# Patient Record
Sex: Female | Born: 1975 | Race: Black or African American | Hispanic: No | Marital: Single | Smoking: Former smoker
Health system: Southern US, Community
[De-identification: ages and names within clinical notes are randomized; demographics above are authoritative.]

## PROBLEM LIST (undated history)

## (undated) DIAGNOSIS — J45909 Unspecified asthma, uncomplicated: Secondary | ICD-10-CM

## (undated) DIAGNOSIS — K219 Gastro-esophageal reflux disease without esophagitis: Secondary | ICD-10-CM

## (undated) DIAGNOSIS — Z91010 Allergy to peanuts: Secondary | ICD-10-CM

## (undated) DIAGNOSIS — F32A Depression, unspecified: Secondary | ICD-10-CM

## (undated) DIAGNOSIS — M549 Dorsalgia, unspecified: Secondary | ICD-10-CM

## (undated) DIAGNOSIS — M2550 Pain in unspecified joint: Secondary | ICD-10-CM

## (undated) DIAGNOSIS — E559 Vitamin D deficiency, unspecified: Secondary | ICD-10-CM

## (undated) DIAGNOSIS — K5900 Constipation, unspecified: Secondary | ICD-10-CM

## (undated) DIAGNOSIS — M7989 Other specified soft tissue disorders: Secondary | ICD-10-CM

## (undated) DIAGNOSIS — R0602 Shortness of breath: Secondary | ICD-10-CM

## (undated) DIAGNOSIS — D649 Anemia, unspecified: Secondary | ICD-10-CM

## (undated) DIAGNOSIS — F419 Anxiety disorder, unspecified: Secondary | ICD-10-CM

## (undated) DIAGNOSIS — R5383 Other fatigue: Secondary | ICD-10-CM

## (undated) DIAGNOSIS — T7840XA Allergy, unspecified, initial encounter: Secondary | ICD-10-CM

## (undated) HISTORY — DX: Shortness of breath: R06.02

## (undated) HISTORY — DX: Other fatigue: R53.83

## (undated) HISTORY — DX: Other specified soft tissue disorders: M79.89

## (undated) HISTORY — DX: Constipation, unspecified: K59.00

## (undated) HISTORY — DX: Vitamin D deficiency, unspecified: E55.9

## (undated) HISTORY — DX: Allergy, unspecified, initial encounter: T78.40XA

## (undated) HISTORY — DX: Pain in unspecified joint: M25.50

## (undated) HISTORY — DX: Anxiety disorder, unspecified: F41.9

## (undated) HISTORY — DX: Anemia, unspecified: D64.9

## (undated) HISTORY — DX: Allergy to peanuts: Z91.010

## (undated) HISTORY — PX: TUBAL LIGATION: SHX77

## (undated) HISTORY — DX: Dorsalgia, unspecified: M54.9

## (undated) HISTORY — DX: Depression, unspecified: F32.A

## (undated) HISTORY — DX: Gastro-esophageal reflux disease without esophagitis: K21.9

---

## 2005-11-10 ENCOUNTER — Emergency Department (HOSPITAL_COMMUNITY): Admission: EM | Admit: 2005-11-10 | Discharge: 2005-11-10 | Payer: Self-pay | Admitting: Emergency Medicine

## 2007-06-23 ENCOUNTER — Emergency Department (HOSPITAL_COMMUNITY): Admission: EM | Admit: 2007-06-23 | Discharge: 2007-06-24 | Payer: Self-pay | Admitting: Emergency Medicine

## 2009-12-07 ENCOUNTER — Other Ambulatory Visit: Admission: RE | Admit: 2009-12-07 | Discharge: 2009-12-07 | Payer: Self-pay | Admitting: Obstetrics and Gynecology

## 2010-07-06 ENCOUNTER — Inpatient Hospital Stay (HOSPITAL_COMMUNITY): Admission: RE | Admit: 2010-07-06 | Discharge: 2010-07-09 | Payer: Self-pay | Admitting: Obstetrics and Gynecology

## 2010-12-28 ENCOUNTER — Emergency Department (HOSPITAL_COMMUNITY)
Admission: EM | Admit: 2010-12-28 | Discharge: 2010-12-28 | Disposition: A | Discharge status: Home / Self Care | Payer: Medicaid Other | Attending: Emergency Medicine | Admitting: Emergency Medicine

## 2010-12-28 DIAGNOSIS — R05 Cough: Secondary | ICD-10-CM | POA: Insufficient documentation

## 2010-12-28 DIAGNOSIS — R070 Pain in throat: Secondary | ICD-10-CM | POA: Insufficient documentation

## 2010-12-28 DIAGNOSIS — J45909 Unspecified asthma, uncomplicated: Secondary | ICD-10-CM | POA: Insufficient documentation

## 2010-12-28 DIAGNOSIS — H9209 Otalgia, unspecified ear: Secondary | ICD-10-CM | POA: Insufficient documentation

## 2010-12-28 DIAGNOSIS — R059 Cough, unspecified: Secondary | ICD-10-CM | POA: Insufficient documentation

## 2010-12-28 DIAGNOSIS — J069 Acute upper respiratory infection, unspecified: Secondary | ICD-10-CM | POA: Insufficient documentation

## 2010-12-28 DIAGNOSIS — J3489 Other specified disorders of nose and nasal sinuses: Secondary | ICD-10-CM | POA: Insufficient documentation

## 2010-12-28 DIAGNOSIS — R0982 Postnasal drip: Secondary | ICD-10-CM | POA: Insufficient documentation

## 2010-12-28 DIAGNOSIS — R51 Headache: Secondary | ICD-10-CM | POA: Insufficient documentation

## 2010-12-28 LAB — CBC
HCT: 35.3 % — ABNORMAL LOW (ref 36.0–46.0)
Hemoglobin: 11.9 g/dL — ABNORMAL LOW (ref 12.0–15.0)
MCH: 28.7 pg (ref 26.0–34.0)
MCH: 29 pg (ref 26.0–34.0)
MCHC: 33.5 g/dL (ref 30.0–36.0)
MCHC: 33.7 g/dL (ref 30.0–36.0)
MCV: 85.2 fL (ref 78.0–100.0)
Platelets: 211 10*3/uL (ref 150–400)
RBC: 3.34 MIL/uL — ABNORMAL LOW (ref 3.87–5.11)
RDW: 14.1 % (ref 11.5–15.5)
RDW: 14.2 % (ref 11.5–15.5)

## 2011-07-27 LAB — POCT PREGNANCY, URINE
Operator id: 173591
Preg Test, Ur: NEGATIVE

## 2012-04-23 ENCOUNTER — Encounter (HOSPITAL_COMMUNITY): Payer: Self-pay | Admitting: Emergency Medicine

## 2012-04-23 ENCOUNTER — Emergency Department (INDEPENDENT_AMBULATORY_CARE_PROVIDER_SITE_OTHER)
Admission: EM | Admit: 2012-04-23 | Discharge: 2012-04-23 | Disposition: A | Discharge status: Home / Self Care | Payer: BC Managed Care – PPO | Source: Home / Self Care | Attending: Emergency Medicine | Admitting: Emergency Medicine

## 2012-04-23 DIAGNOSIS — J039 Acute tonsillitis, unspecified: Secondary | ICD-10-CM

## 2012-04-23 MED ORDER — AMOXICILLIN-POT CLAVULANATE 500-125 MG PO TABS: 1.0000 | ORAL_TABLET | Freq: Three times a day (TID) | ORAL | Status: AC

## 2012-04-23 MED ORDER — IBUPROFEN 800 MG PO TABS
800.0000 mg | ORAL_TABLET | Freq: Once | ORAL | Status: AC
  Administered 2012-04-23: 800 mg via ORAL

## 2012-04-23 MED ORDER — IBUPROFEN 800 MG PO TABS
ORAL_TABLET | ORAL | Status: AC
  Filled 2012-04-23: qty 1

## 2012-04-23 MED ORDER — HYDROCODONE-ACETAMINOPHEN 7.5-500 MG/15ML PO SOLN: 15.0000 mL | Freq: Four times a day (QID) | ORAL | Status: AC | PRN

## 2012-04-23 NOTE — ED Provider Notes (Signed)
History     CSN: [id number]  Arrival date & time 04/23/12  1335   First MD Initiated Contact with Patient 04/23/12 1508      Chief Complaint  Patient presents with  . Sore Throat    (Consider location/radiation/quality/duration/timing/severity/associated sxs/prior treatment) HPI Comments: Patient presents urgent care complaining of an ongoing sore throat for 3 days pain on the left side of her neck and throat. Have been having fevers chills. No cough, no shortness of breath no nasal congestion. 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 She has been taking Motrin and some Tylenol Sinus over-the-counter for her discomfort.  Patient is a 36 y.o. female presenting with pharyngitis. The history is provided by the patient.  Sore Throat This is a new problem. The current episode started more than 2 days ago. The problem occurs constantly. The problem has been gradually worsening. Pertinent negatives include no abdominal pain and no shortness of breath. The symptoms are aggravated by swallowing. She has tried nothing for the symptoms. The treatment provided mild relief.    History reviewed. No pertinent past medical history.  History reviewed. No pertinent past surgical history.  No family history on file.  History  Substance Use Topics  . Smoking status: Never Smoker   . Smokeless tobacco: Not on file  . Alcohol Use: No    OB History    Grav Para Term Preterm Abortions TAB SAB Ect Mult Living                  Review of Systems  Constitutional: Positive for fever, chills, activity change and appetite change. Negative for fatigue.  HENT: Positive for sore throat. Negative for congestion, sneezing and postnasal drip.   Respiratory: Negative for shortness of breath.   Gastrointestinal: Negative for abdominal pain.  Skin: Negative for rash.  Neurological: Negative for dizziness and numbness.     Allergies  Review of patient's allergies indicates no known allergies.  Home Medications   Current Outpatient Rx  Name Route Sig Dispense Refill  . PSEUDOEPHEDRINE-ACETAMINOPHEN 30-500 MG PO TABS Oral Take 1 tablet by mouth every 4 (four) hours as needed.    . THERAFLU NIGHTTIME MEDICINE PO Oral Take by mouth.      BP 119/75  Pulse 113  Temp 103 F (39.4 C) (Oral)  Resp 21  SpO2 100%  LMP 04/15/2012  Physical Exam  Nursing note and vitals reviewed. Constitutional: She appears well-developed and well-nourished.  HENT:  Head: Normocephalic.  Right Ear: Tympanic membrane normal.  Left Ear: Tympanic membrane normal.  Mouth/Throat: Uvula is midline and mucous membranes are normal. Posterior oropharyngeal erythema present. No oropharyngeal exudate, posterior oropharyngeal edema or tonsillar abscesses.  Eyes: Conjunctivae and EOM are normal. Pupils are equal, round, and reactive to light. No scleral icterus.  Neck: Neck supple. No JVD present.  Lymphadenopathy:    She has no cervical adenopathy.  Skin: No rash noted.    ED Course  Procedures (including critical care time)   Labs Reviewed  POCT RAPID STREP A ([facility] CARE ONLY)   No results found.   No diagnosis found.    MDM  Left-sided tonsillitis with reactive lymphadenopathy is an febrile to 103. Ruling out a streptococcal infection based on exam and unilateral aspect of her exam will consider a bacterial source and have her start with antibiotics and followup in 48-72 hours. Have advised her about symptoms that would warrant further  evaluation the emergency department such as difficulty swallowing liquids or fluids. Or worsening symptoms despite antibiotics        Jimmie Molly, MD 04/23/12 1541

## 2012-04-23 NOTE — ED Notes (Signed)
Onset 04/21/2012 of sore throat , fever, chills.  Denies cough. Denies any family members sick.  Denies clients being sick.

## 2013-01-15 ENCOUNTER — Other Ambulatory Visit: Payer: Self-pay | Admitting: Obstetrics and Gynecology

## 2013-01-15 ENCOUNTER — Other Ambulatory Visit (HOSPITAL_COMMUNITY)
Admission: RE | Admit: 2013-01-15 | Discharge: 2013-01-15 | Disposition: A | Discharge status: Home / Self Care | Payer: BC Managed Care – PPO | Source: Ambulatory Visit | Attending: Obstetrics and Gynecology | Admitting: Obstetrics and Gynecology

## 2013-01-15 DIAGNOSIS — Z113 Encounter for screening for infections with a predominantly sexual mode of transmission: Secondary | ICD-10-CM | POA: Insufficient documentation

## 2013-01-15 DIAGNOSIS — Z1151 Encounter for screening for human papillomavirus (HPV): Secondary | ICD-10-CM | POA: Insufficient documentation

## 2013-01-15 DIAGNOSIS — Z01419 Encounter for gynecological examination (general) (routine) without abnormal findings: Secondary | ICD-10-CM | POA: Insufficient documentation

## 2014-02-08 ENCOUNTER — Other Ambulatory Visit: Payer: Self-pay | Admitting: Obstetrics and Gynecology

## 2014-02-08 ENCOUNTER — Other Ambulatory Visit (HOSPITAL_COMMUNITY)
Admission: RE | Admit: 2014-02-08 | Discharge: 2014-02-08 | Disposition: A | Discharge status: Home / Self Care | Payer: BC Managed Care – PPO | Source: Ambulatory Visit | Attending: Obstetrics and Gynecology | Admitting: Obstetrics and Gynecology

## 2014-02-08 DIAGNOSIS — Z124 Encounter for screening for malignant neoplasm of cervix: Secondary | ICD-10-CM | POA: Insufficient documentation

## 2014-02-08 DIAGNOSIS — Z113 Encounter for screening for infections with a predominantly sexual mode of transmission: Secondary | ICD-10-CM | POA: Insufficient documentation

## 2014-02-08 DIAGNOSIS — Z1151 Encounter for screening for human papillomavirus (HPV): Secondary | ICD-10-CM | POA: Insufficient documentation

## 2014-05-03 ENCOUNTER — Emergency Department (HOSPITAL_COMMUNITY): Payer: BC Managed Care – PPO

## 2014-05-03 ENCOUNTER — Encounter (HOSPITAL_COMMUNITY): Payer: Self-pay | Admitting: Emergency Medicine

## 2014-05-03 ENCOUNTER — Emergency Department (HOSPITAL_COMMUNITY)
Admission: EM | Admit: 2014-05-03 | Discharge: 2014-05-04 | Disposition: A | Discharge status: Home / Self Care | Payer: BC Managed Care – PPO | Attending: Emergency Medicine | Admitting: Emergency Medicine

## 2014-05-03 DIAGNOSIS — Z3202 Encounter for pregnancy test, result negative: Secondary | ICD-10-CM | POA: Insufficient documentation

## 2014-05-03 DIAGNOSIS — E860 Dehydration: Secondary | ICD-10-CM | POA: Diagnosis present

## 2014-05-03 DIAGNOSIS — R61 Generalized hyperhidrosis: Secondary | ICD-10-CM | POA: Insufficient documentation

## 2014-05-03 DIAGNOSIS — Z79899 Other long term (current) drug therapy: Secondary | ICD-10-CM | POA: Insufficient documentation

## 2014-05-03 DIAGNOSIS — R110 Nausea: Secondary | ICD-10-CM | POA: Insufficient documentation

## 2014-05-03 DIAGNOSIS — R51 Headache: Secondary | ICD-10-CM | POA: Insufficient documentation

## 2014-05-03 DIAGNOSIS — J45909 Unspecified asthma, uncomplicated: Secondary | ICD-10-CM | POA: Insufficient documentation

## 2014-05-03 DIAGNOSIS — R079 Chest pain, unspecified: Secondary | ICD-10-CM | POA: Insufficient documentation

## 2014-05-03 HISTORY — DX: Unspecified asthma, uncomplicated: J45.909

## 2014-05-03 LAB — I-STAT TROPONIN, ED
Troponin i, poc: 0 ng/mL (ref 0.00–0.08)
Troponin i, poc: 0 ng/mL (ref 0.00–0.08)

## 2014-05-03 LAB — BASIC METABOLIC PANEL
Anion gap: 9 (ref 5–15)
BUN: 16 mg/dL (ref 6–23)
CHLORIDE: 106 meq/L (ref 96–112)
CO2: 24 meq/L (ref 19–32)
Calcium: 8.6 mg/dL (ref 8.4–10.5)
Creatinine, Ser: 0.97 mg/dL (ref 0.50–1.10)
GFR calc Af Amer: 85 mL/min — ABNORMAL LOW (ref >90)
GFR, EST NON AFRICAN AMERICAN: 74 mL/min — AB (ref >90)
GLUCOSE: 82 mg/dL (ref 70–99)
Potassium: 4.6 mEq/L (ref 3.7–5.3)
SODIUM: 139 meq/L (ref 137–147)

## 2014-05-03 LAB — CBC
HEMATOCRIT: 35.2 % — AB (ref 36.0–46.0)
HEMOGLOBIN: 11.6 g/dL — AB (ref 12.0–15.0)
MCH: 27.6 pg (ref 26.0–34.0)
MCHC: 33 g/dL (ref 30.0–36.0)
MCV: 83.6 fL (ref 78.0–100.0)
Platelets: 266 10*3/uL (ref 150–400)
RBC: 4.21 MIL/uL (ref 3.87–5.11)
RDW: 13.7 % (ref 11.5–15.5)
WBC: 6.4 10*3/uL (ref 4.0–10.5)

## 2014-05-03 LAB — D-DIMER, QUANTITATIVE (NOT AT ARMC): D DIMER QUANT: 0.66 ug{FEU}/mL — AB (ref 0.00–0.48)

## 2014-05-03 LAB — POC URINE PREG, ED: PREG TEST UR: NEGATIVE

## 2014-05-03 MED ORDER — IOHEXOL 350 MG/ML SOLN
100.0000 mL | Freq: Once | INTRAVENOUS | Status: AC | PRN
  Administered 2014-05-03: 100 mL via INTRAVENOUS

## 2014-05-03 NOTE — ED Notes (Signed)
Pt presents with chest pain that radiated to left arm started around 1415 today- pt reports pain has resolved but she feels like her heart is racing and feeling weak.  Denies having similar episode in the past.  Alert and oriented X 4.

## 2014-05-03 NOTE — Discharge Instructions (Signed)
Please follow-up with your Primary Care Physician. You may need a stress test. This will evaluate your heart under exertion. Your PCP will be able to arrange this for you. If you have recurrent chest pain lasting more than 5-10 minutes, especially when exerting yourself, please take 4 baby aspirins or one aspirin 325mg  and seek immediate medical attention.  Chest Pain (Nonspecific) It is often hard to give a specific diagnosis for the cause of chest pain. There is always a chance that your pain could be related to something serious, such as a heart attack or a blood clot in the lungs. You need to follow up with your health care provider for further evaluation. CAUSES   Heartburn.  Pneumonia or bronchitis.  Anxiety or stress.  Inflammation around your heart (pericarditis) or lung (pleuritis or pleurisy).  A blood clot in the lung.  A collapsed lung (pneumothorax). It can develop suddenly on its own (spontaneous pneumothorax) or from trauma to the chest.  Shingles infection (herpes zoster virus). The chest wall is composed of bones, muscles, and cartilage. Any of these can be the source of the pain.  The bones can be bruised by injury.  The muscles or cartilage can be strained by coughing or overwork.  The cartilage can be affected by inflammation and become sore (costochondritis). DIAGNOSIS  Lab tests or other studies may be needed to find the cause of your pain. Your health care provider may have you take a test called an ambulatory electrocardiogram (ECG). An ECG records your heartbeat patterns over a 24-hour period. You may also have other tests, such as:  Transthoracic echocardiogram (TTE). During echocardiography, sound waves are used to evaluate how blood flows through your heart.  Transesophageal echocardiogram (TEE).  Cardiac monitoring. This allows your health care provider to monitor your heart rate and rhythm in real time.  Holter monitor. This is a portable device that  records your heartbeat and can help diagnose heart arrhythmias. It allows your health care provider to track your heart activity for several days, if needed.  Stress tests by exercise or by giving medicine that makes the heart beat faster. TREATMENT   Treatment depends on what may be causing your chest pain. Treatment may include:  Acid blockers for heartburn.  Anti-inflammatory medicine.  Pain medicine for inflammatory conditions.  Antibiotics if an infection is present.  You may be advised to change lifestyle habits. This includes stopping smoking and avoiding alcohol, caffeine, and chocolate.  You may be advised to keep your head raised (elevated) when sleeping. This reduces the chance of acid going backward from your stomach into your esophagus. Most of the time, nonspecific chest pain will improve within 2-3 days with rest and mild pain medicine.  HOME CARE INSTRUCTIONS   If antibiotics were prescribed, take them as directed. Finish them even if you start to feel better.  For the next few days, avoid physical activities that bring on chest pain. Continue physical activities as directed.  Do not use any tobacco products, including cigarettes, chewing tobacco, or electronic cigarettes.  Avoid drinking alcohol.  Only take medicine as directed by your health care provider.  Follow your health care provider's suggestions for further testing if your chest pain does not go away.  Keep any follow-up appointments you made. If you do not go to an appointment, you could develop lasting (chronic) problems with pain. If there is any problem keeping an appointment, call to reschedule. SEEK MEDICAL CARE IF:   Your chest pain does  not go away, even after treatment.  You have a rash with blisters on your chest.  You have a fever. SEEK IMMEDIATE MEDICAL CARE IF:   You have increased chest pain or pain that spreads to your arm, neck, jaw, back, or abdomen.  You have shortness of  breath.  You have an increasing cough, or you cough up blood.  You have severe back or abdominal pain.  You feel nauseous or vomit.  You have severe weakness.  You faint.  You have chills. This is an emergency. Do not wait to see if the pain will go away. Get medical help at once. Call your local emergency services (911 in [place] Do not drive yourself to the hospital. MAKE SURE YOU:   Understand these instructions.  Will watch your condition.  Will get help right away if you are not doing well or get worse. Document Released: 07/11/2005 Document Revised: 10/06/2013 Document Reviewed: 05/06/2008 [facility] Patient Information 2015 [place], [place]. This information is not intended to replace advice given to you by your health care provider. Make sure you discuss any questions you have with your health care provider.

## 2014-05-03 NOTE — ED Provider Notes (Signed)
CSN: [id number]     Arrival date & time 05/03/14  1657 History   First MD Initiated Contact with Patient 05/03/14 2044     Chief Complaint  Patient presents with  . Chest Pain     (Consider location/radiation/quality/duration/timing/severity/associated sxs/prior Treatment) Patient is a [age] y.o. female presenting with chest pain.  Chest Pain Pain location:  Substernal area Pain quality: sharp   Pain radiates to:  Does not radiate Pain radiates to the back: no   Pain severity:  Moderate Onset quality:  Sudden Duration:  2 hours Timing:  Constant Context: at rest   Relieved by:  None tried Worsened by:  Nothing tried Ineffective treatments:  None tried Associated symptoms: diaphoresis, headache (chronic), nausea, palpitations and shortness of breath   Associated symptoms: not vomiting     Past Medical History  Diagnosis Date  . Asthma     History reviewed. No pertinent past surgical history. Family History  Problem Relation Age of Onset  . Stroke Maternal Aunt    History  Substance Use Topics  . Smoking status: Never Smoker   . Smokeless tobacco: Not on file  . Alcohol Use: 1.2 oz/week    2 Cans of beer per week  1-2 beers per week  OB History   Grav Para Term Preterm Abortions TAB SAB Ect Mult Living                 Review of Systems  Constitutional: Positive for diaphoresis.  Respiratory: Positive for shortness of breath.   Cardiovascular: Positive for chest pain and palpitations.  Gastrointestinal: Positive for nausea. Negative for vomiting.  Neurological: Positive for headaches (chronic).  All other systems reviewed and are negative.     Allergies  Peanut-containing drug products  Home Medications   Prior to Admission medications   Medication Sig Start Date End Date Taking? Authorizing Provider  albuterol (PROVENTIL HFA;VENTOLIN HFA) 108 (90 BASE) MCG/ACT inhaler Inhale 1-2 puffs into the lungs every 6 (six) hours as needed for wheezing or  shortness of breath.   Yes Historical Provider, MD  diphenhydrAMINE (BENADRYL) 25 mg capsule Take 25 mg by mouth daily as needed for allergies.   Yes Historical Provider, MD  Multiple Vitamins-Minerals (MULTIVITAMIN PO) Take 1 tablet by mouth daily.   Yes Historical Provider, MD  Norethindrone-Ethinyl Estradiol-Fe Biphas (LO LOESTRIN FE) 1 MG-10 MCG / 10 MCG tablet Take 1 tablet by mouth daily.   Yes Historical Provider, MD  olopatadine (PATANOL) 0.1 % ophthalmic solution Place 1 drop into both eyes daily as needed for allergies.   Yes Historical Provider, MD   BP 119/78  Pulse 73  Temp(Src) 99.4 F (37.4 C) (Oral)  Resp 16  SpO2 100%  LMP 05/03/2014 Physical Exam  Constitutional: She is oriented to person, place, and time. She appears well-developed and well-nourished.  Eyes: Conjunctivae and EOM are normal.  Cardiovascular: Normal rate, regular rhythm and normal heart sounds.   No murmur heard. Pulmonary/Chest: Effort normal and breath sounds normal. No respiratory distress. She has no wheezes. She has no rales.  Abdominal: Soft. Bowel sounds are normal.  Musculoskeletal:       Right lower leg: She exhibits no tenderness, no swelling and no edema.       Left lower leg: She exhibits no tenderness, no swelling and no edema.  Holman sign negative  Neurological: She is alert and oriented to person, place, and time.    ED Course  Procedures (including critical care time) Labs Review Labs  Reviewed  CBC - Abnormal; Notable for the following:    Hemoglobin 11.6 (*)    HCT 35.2 (*)    All other components within normal limits  BASIC METABOLIC PANEL - Abnormal; Notable for the following:    GFR calc non Af Amer 74 (*)    GFR calc Af Amer 85 (*)    All other components within normal limits  D-DIMER, QUANTITATIVE - Abnormal; Notable for the following:    D-Dimer, Quant 0.66 (*)    All other components within normal limits  I-STAT TROPOININ, ED  Rosezena Sensor, ED  POC URINE PREG,  ED    Imaging Review Dg Chest 2 View  05/03/2014   CLINICAL DATA:  Mid chest pain.  EXAM: CHEST  2 VIEW  COMPARISON:  Chest radiograph performed 06/23/2007  FINDINGS: The lungs are well-aerated. Mild vascular congestion is noted. Minimal bibasilar atelectasis is noted. There is no evidence of pleural effusion or pneumothorax.  The heart is normal in size; the mediastinal contour is within normal limits. No acute osseous abnormalities are seen.  IMPRESSION: Mild vascular congestion noted. Minimal bibasilar atelectasis seen. Lungs otherwise clear.   Electronically Signed   By: Roanna Raider M.D.   On: 05/03/2014 23:33     EKG Interpretation   Date/Time:  Monday May 03 2014 17:08:11 EDT Ventricular Rate:  95 PR Interval:  166 QRS Duration: 64 QT Interval:  330 QTC Calculation: 414 R Axis:   44 Text Interpretation:  Normal sinus rhythm Low voltage QRS Cannot rule out  Anterior infarct , age undetermined Abnormal ECG No significant change  since last tracing Confirmed by BEATON  MD, ROBERT ([id number]) on 05/03/2014  11:23:13 PM      MDM   Final diagnoses:  Chest pain, unspecified chest pain type   Patient's chest pain resolved before arrival to the ED. No symptoms of shortness of breath or diaphoresis. EKG without sign of acute MI. Negative delta troponin. Chest x-ray not significant for cause of chest pain. D-dimer elevated to 0.66 with negative follow-up CT angiogram for pulmonary embolism. Recommended patient follow-up with PCP and have follow-up stress test. Red flags reviewed. Patient understands and agrees with plan.  Jacquelin Hawking, MD 05/04/14 [phone]

## 2014-05-04 NOTE — ED Provider Notes (Signed)
I saw and evaluated the patient, reviewed the resident's note and I agree with the findings and plan.   .Face to face Exam:  General:  Awake HEENT:  Atraumatic Resp:  Normal effort Abd:  Nondistended Neuro:No focal weakness  EKG was reviewed by me and discussed with the resident  Nelia Shiobert L Khristian Seals, MD 05/04/14 1535

## 2015-02-11 ENCOUNTER — Other Ambulatory Visit: Payer: Self-pay | Admitting: Obstetrics and Gynecology

## 2015-02-11 ENCOUNTER — Other Ambulatory Visit (HOSPITAL_COMMUNITY)
Admission: RE | Admit: 2015-02-11 | Discharge: 2015-02-11 | Disposition: A | Discharge status: Home / Self Care | Payer: Medicaid Other | Source: Ambulatory Visit | Attending: Obstetrics and Gynecology | Admitting: Obstetrics and Gynecology

## 2015-02-11 DIAGNOSIS — Z01411 Encounter for gynecological examination (general) (routine) with abnormal findings: Secondary | ICD-10-CM | POA: Diagnosis not present

## 2015-02-11 DIAGNOSIS — Z113 Encounter for screening for infections with a predominantly sexual mode of transmission: Secondary | ICD-10-CM | POA: Diagnosis present

## 2015-02-14 LAB — CYTOLOGY - PAP

## 2015-12-29 ENCOUNTER — Other Ambulatory Visit (HOSPITAL_COMMUNITY): Payer: Self-pay | Admitting: Obstetrics and Gynecology

## 2015-12-29 NOTE — Patient Instructions (Signed)
Your procedure is scheduled on:  Wednesday, January 11, 2016  [facility]:  6:00 AM   Pick up the phone at the desk and dial [phone].  Call this number if you have problems the morning of surgery: [phone].  Remember:  Do NOT eat food or drink after:  Midnight Tuesday  Take these medicines the morning of surgery with a SIP OF WATER:  None  Bring Asthma inhaler day of surgery  Do NOT wear jewelry (body piercing), metal hair clips/bobby pins, make-up, or nail polish. Do NOT wear lotions, powders, or perfumes.  You may wear deoderant. Do NOT shave for 48 hours prior to surgery. Do NOT bring valuables to the hospital. Contacts, dentures, or bridgework may not be worn into surgery.  Have a responsible adult drive you home and stay with you for 24 hours after your procedure

## 2015-12-30 ENCOUNTER — Encounter (HOSPITAL_COMMUNITY)
Admission: RE | Admit: 2015-12-30 | Discharge: 2015-12-30 | Disposition: A | Discharge status: Home / Self Care | Payer: Medicaid Other | Source: Ambulatory Visit | Attending: Obstetrics and Gynecology | Admitting: Obstetrics and Gynecology

## 2015-12-30 ENCOUNTER — Encounter (HOSPITAL_COMMUNITY): Payer: Self-pay

## 2015-12-30 DIAGNOSIS — Z01812 Encounter for preprocedural laboratory examination: Secondary | ICD-10-CM | POA: Diagnosis present

## 2015-12-30 LAB — CBC
HCT: 36.2 % (ref 36.0–46.0)
Hemoglobin: 12.2 g/dL (ref 12.0–15.0)
MCH: 28 pg (ref 26.0–34.0)
MCHC: 33.7 g/dL (ref 30.0–36.0)
MCV: 83 fL (ref 78.0–100.0)
PLATELETS: 263 10*3/uL (ref 150–400)
RBC: 4.36 MIL/uL (ref 3.87–5.11)
RDW: 14.4 % (ref 11.5–15.5)
WBC: 5.4 10*3/uL (ref 4.0–10.5)

## 2016-01-10 ENCOUNTER — Encounter (HOSPITAL_COMMUNITY): Payer: Self-pay | Admitting: Anesthesiology

## 2016-01-10 ENCOUNTER — Other Ambulatory Visit (HOSPITAL_COMMUNITY): Payer: Self-pay | Admitting: Obstetrics and Gynecology

## 2016-01-10 NOTE — H&P (Signed)
Chief Complaint(s):   PreOp for 01/11/16   HPI:  General 40 y/o presents for preop history and physical in preparation for bilateral laparoscopic tubal ligation scheduled for 01/11/2016. she desires permanent sterilization.  Current Medication:  Taking  ProAir HFA(Albuterol Sulfate HFA) 108 (90 Base) MCG/ACT Aerosol Solution 2 puffs as needed Inhalation every 4 hrs prn wheezing     Loestrin 1/20 (21)(Norethindrone Acet-Ethinyl Est) 1-20 MG-MCG Tablet 1 tablet Orally Daily for Three Weeks, 1 Week off     Medication List reviewed and reconciled with the patient   Medical History:   endometriosis     asthma     allergies     anemia      Allergies/Intolerance:   [place] pigs, peanuts, mold, grasses   Gyn History:   Sexual activity currently sexually active. Periods : every month. LMP 12/11/15. Birth control Lo loestrin. Last pap smear date 02/11/15. Last mammogram date more than two years-family hx. H/O Abnormal pap smear treated with cryo, assessed with colposcopy. H/O STD Gonorrhea, syphillis(6 years ago), [facility] 2014. Menarche 13. GYN procedures Colposcopy then cryotherapy 2006.   OB History:   Number of pregnancies 3. Pregnancy # 1 1998, live birth, girl, vaginal delivery. Pregnancy # 2 07/07/10, live birth, vaginal delivery, boy "[place]", birth weight 5.7. Pregnancy # 3 abortion.   Surgical History:   abortion 12/2013   Hospitalization:   childbirth x 1 1998     childbirth x 1 [facility] 07/07/10   Family History:   Father: alive, A + W    Mother: alive, fibrocystic breast disease, migraines    Maternal Grand Father: Colon cancer    Maternal Grand Mother: breast cancer, diagnosed with Breast Ca    Brother 1: alive, A + W    Sister 1: alive, A + W, migraines    Maternal uncle: Colon cancer    1 brother(s) , 1 sister(s) . 1 son(s) , 1 daughter(s) .   Social History:  General Tobacco use cigarettes: Former smoker, Quit in year 2000, Tobacco history last updated 12/27/2015.  no  EXPOSURE TO PASSIVE SMOKE.  Alcohol: yes, occasionally, beer .  Caffeine: yes, tea in am,coffee.  DIET: yes, drinks water, regular.  Exercise: yes, minimal, nothing structured.  Marital Status: single.  Children: 2.  OCCUPATION: employed, Production designer, theatre/television/film.  Seat belt use: yes.  ROS: CONSTITUTIONAL none" label="Fatigue" value="" options="no,yes" propid="91" itemid="[id number]" categoryid="10464" encounterid="[id number]"Fatigue none. none today" label="Fever" value="" options="no,yes" propid="91" itemid="10467" categoryid="10464" encounterid="[id number]"Fever none today.  CARDIOLOGY none" label="Chest pain" value="" options="no,yes" propid="91" itemid="[id number]" categoryid="10488" encounterid="[id number]"Chest pain none.  RESPIRATORY no" label="Shortness of breath" value="" options="no" propid="91" itemid="270013" categoryid="138132" encounterid="[id number]"Shortness of breath no. no" label="Cough" value="" options="no,yes" propid="91" itemid="172745" categoryid="138132" encounterid="[id number]"Cough no.  GASTROENTEROLOGY none" label="Appetite change" value="" options="no,yes" propid="91" itemid="193447" categoryid="10494" encounterid="[id number]"Appetite change none. no" label="Change in bowel habits" value="" options="no,yes" propid="91" itemid="193449" categoryid="10494" encounterid="[id number]"Change in bowel habits no.  FEMALE REPRODUCTIVE no" label="Breast lumps or discharge" value="" options="no,yes" propid="91" itemid="196298" categoryid="10525" encounterid="[id number]"Breast lumps or discharge no. none" label="Breast pain" value="" options="no,yes" propid="91" itemid="186083" categoryid="10525" encounterid="[id number]"Breast pain none. none" label="Dyspareunia" value="" options="no,yes" propid="91" itemid="138198" categoryid="10525" encounterid="[id number]"Dyspareunia none. no" label="Dysuria" value="" options="no,yes" propid="91" itemid="202654" categoryid="10525" encounterid="[id number]"Dysuria no. none" label="Pelvic  pain" value="" options="no,yes" propid="91" itemid="186082" categoryid="10525" encounterid="[id number]"Pelvic pain none. yes" label="Regular menses" value="" options="no,yes" propid="91" itemid="199173" categoryid="10525" encounterid="[id number]"Regular menses yes. yes" label="Unusual vaginal discharge" value="" options="no,yes" propid="91" itemid="278230" categoryid="10525" encounterid="[id number]"Unusual vaginal discharge yes. no" label="Vaginal itching" value="" options="no,yes" propid="91" itemid="278942" categoryid="10525" encounterid="[id number]"Vaginal itching no. no" label="Vulvar/labial lesion" value="" options="no,yes" propid="91" itemid="278837" categoryid="10525" encounterid="[id number]"Vulvar/labial lesion no.  NEUROLOGY none" label="Migraines" value="" options="no,yes"  propid="91" [phone]="193627" categoryid="12512" encounterid="[id number]"Migraines none. none" label="Tingling/numbness" value="" options="no,yes" propid="91" itemid="12514" categoryid="12512" encounterid="[id number]"Tingling/numbness none. none" label="Visual changes" value="" options="no,yes" propid="91" itemid="193467" categoryid="12512" encounterid="[id number]"Visual changes none.  PSYCHOLOGY no" label="Depression" value="" options="" propid="91" itemid="275919" categoryid="10520" encounterid="[id number]"Depression no.  SKIN no" label="Rash" value="" options="no,yes" propid="91" itemid="269383" categoryid="202750" encounterid="[id number]"Rash no. no" label="Suspicious lesions" value="" options="no,yes" propid="91" itemid="202757" categoryid="202750" encounterid="[id number]"Suspicious lesions no.  ENDOCRINOLOGY none" label="Hot flashes" value="" options="no,yes" propid="91" itemid="202624" categoryid="12508" encounterid="[id number]"Hot flashes none. no unintentional" label="Weight gain" value="" options="no,yes" propid="91" itemid="193436" categoryid="12508" encounterid="[id number]"Weight gain no unintentional. none" label="Weight loss" value="" options="no,yes"  propid="91" itemid="138164" categoryid="12508" encounterid="[id number]"Weight loss none.  HEMATOLOGY/LYMPH no" label="Anemia" value="" options="no,yes" propid="91" itemid="193454" categoryid="138157" encounterid="[id number]"Anemia no.    Objective: Vitals:  Wt 240, Wt change 12 lb, Pulse sitting 83, BP sitting 112/79  Past Results:  Examination:  General Examination McCoy,Tiffany 12/27/2015 09:11:56 AM &gt; , for pelvic exam only" label="CHAPERONE PRESENT" categoryPropId="[id number]" examid="[id number]"CHAPERONE PRESENT McCoy,Tiffany 12/27/2015 09:11:56 AM > , for pelvic exam only.  Physical Examination: GENERAL in NAD, pleasant" label="Patient appears"Patient appears in NAD, pleasant. well developed" label="Build:"Build: well developed. overweight" label="General Appearance:"General Appearance: overweight. african-american" label="Race:"Race: african-american.  LUNGS clear to auscultation" label="Breath sounds:"Breath sounds: clear to auscultation. no" label="Dyspnea:"Dyspnea: no.  HEART none" label="Murmurs:"Murmurs: none. normal" label="Rate:"Rate: normal. regular" label="Rhythm:"Rhythm: regular.  ABDOMEN no masses,tenderness,organomegaly, no CVAT" label="General:"General: no masses,tenderness,organomegaly, no CVAT.  FEMALE GENITOURINARY no mass, non tender" label="Adnexa:"Adnexa: no mass, non tender. normal, no lesions" label="Anus/perineum:"Anus/perineum: normal, no lesions. normal appearance , no lesions/discharge/bleeding, good pelvic support , external os normal " label="Cervix/ cuff:"Cervix/ cuff: normal appearance , no lesions/discharge/bleeding, good pelvic support , external os normal . normal, no lesions, no skin discoloration, no lymphadenopathy" label="External genitalia:"External genitalia: normal, no lesions, no skin discoloration, no lymphadenopathy. normal external meatus" label="Urethra:"Urethra: normal external meatus. normal size/shape/consistency, freely mobile, non tender"  label="Uterus:"Uterus: normal size/shape/consistency, freely mobile, non tender. deferred" label="Rectum:"Rectum: deferred. pink/moist mucosa, no lesions, no abnormal discharge, odorless" label="Vagina:"Vagina: pink/moist mucosa, no lesions, no abnormal discharge, odorless. normal, no lesions, no skin discoloration, non tender" label="Vulva:"Vulva: normal, no lesions, no skin discoloration, non tender.  EXTREMITIES FROM of all extremities" label="Extremities"Extremities FROM of all extremities.  NEUROLOGICAL normal" label="Gait:"Gait: normal. alert and oriented x 3" label="Orientation:"Orientation: alert and oriented x 3.    Assessment: Assessment:  Encounter for sterilization - Z30.2 (Primary)     Vaginal discharge - N89.8     Plan: Treatment:  Encounter for sterilization  Notes: pt desires laparoscopic bilateral tubal ligation. R/B/A of surgery discussed with the patient including but not limited to infection/ bleeding damage to bowel bladder with the need for further surgery. 1 % r/o failure discussed with 50 % r/o ectopic pregnancy if failure occurs .. pt voiced understanding and desires to proceed.  Vaginal discharge  Lab:[facility]

## 2016-01-10 NOTE — Anesthesia Preprocedure Evaluation (Addendum)
Anesthesia Evaluation  Patient identified by MRN, date of birth, ID band Patient awake    Reviewed: Allergy & Precautions, H&P , NPO status , Patient's Chart, lab work & pertinent test results  Airway Mallampati: III  TM Distance: >3 FB Neck ROM: full    Dental no notable dental hx. (+) Teeth Intact   Pulmonary    Pulmonary exam normal        Cardiovascular negative cardio ROS Normal cardiovascular exam     Neuro/Psych negative neurological ROS  negative psych ROS   GI/Hepatic negative GI ROS, Neg liver ROS,   Endo/Other  Morbid obesity  Renal/GU negative Renal ROS     Musculoskeletal   Abdominal (+) + obese,   Peds  Hematology negative hematology ROS (+)   Anesthesia Other Findings   Reproductive/Obstetrics negative OB ROS                            Anesthesia Physical Anesthesia Plan  ASA: III  Anesthesia Plan: General   Post-op Pain Management:    Induction: Intravenous  Airway Management Planned: Oral ETT  Additional Equipment:   Intra-op Plan:   Post-operative Plan: Extubation in OR  Informed Consent:   Plan Discussed with: Surgeon and CRNA  Anesthesia Plan Comments:        Anesthesia Quick Evaluation

## 2016-01-11 ENCOUNTER — Encounter (HOSPITAL_COMMUNITY): Payer: Self-pay

## 2016-01-11 ENCOUNTER — Ambulatory Visit (HOSPITAL_COMMUNITY): Payer: Medicaid Other | Admitting: Anesthesiology

## 2016-01-11 ENCOUNTER — Encounter (HOSPITAL_COMMUNITY)
Admission: RE | Disposition: A | Discharge status: Home / Self Care | Payer: Self-pay | Source: Ambulatory Visit | Attending: Obstetrics and Gynecology

## 2016-01-11 ENCOUNTER — Ambulatory Visit (HOSPITAL_COMMUNITY)
Admission: RE | Admit: 2016-01-11 | Discharge: 2016-01-11 | Disposition: A | Discharge status: Home / Self Care | Payer: Medicaid Other | Source: Ambulatory Visit | Attending: Obstetrics and Gynecology | Admitting: Obstetrics and Gynecology

## 2016-01-11 DIAGNOSIS — Z6839 Body mass index (BMI) 39.0-39.9, adult: Secondary | ICD-10-CM | POA: Diagnosis not present

## 2016-01-11 DIAGNOSIS — Z302 Encounter for sterilization: Secondary | ICD-10-CM | POA: Insufficient documentation

## 2016-01-11 DIAGNOSIS — Z9851 Tubal ligation status: Secondary | ICD-10-CM

## 2016-01-11 HISTORY — PX: LAPAROSCOPIC TUBAL LIGATION: SHX1937

## 2016-01-11 LAB — PREGNANCY, URINE: Preg Test, Ur: NEGATIVE

## 2016-01-11 SURGERY — LIGATION, FALLOPIAN TUBE, LAPAROSCOPIC
Anesthesia: General | Laterality: Bilateral

## 2016-01-11 MED ORDER — ONDANSETRON HCL 4 MG/2ML IJ SOLN
INTRAMUSCULAR | Status: AC
Start: 2016-01-11 — End: 2016-01-11
  Filled 2016-01-11: qty 2

## 2016-01-11 MED ORDER — ROCURONIUM BROMIDE 100 MG/10ML IV SOLN
INTRAVENOUS | Status: DC | PRN
  Administered 2016-01-11: 25 mg via INTRAVENOUS
  Administered 2016-01-11: 10 mg via INTRAVENOUS

## 2016-01-11 MED ORDER — NEOSTIGMINE METHYLSULFATE 10 MG/10ML IV SOLN
INTRAVENOUS | Status: AC
  Filled 2016-01-11: qty 1

## 2016-01-11 MED ORDER — FENTANYL CITRATE (PF) 100 MCG/2ML IJ SOLN: 25.0000 ug | INTRAMUSCULAR | Status: DC | PRN

## 2016-01-11 MED ORDER — DEXAMETHASONE SODIUM PHOSPHATE 10 MG/ML IJ SOLN
INTRAMUSCULAR | Status: DC | PRN
  Administered 2016-01-11: 4 mg via INTRAVENOUS

## 2016-01-11 MED ORDER — NEOSTIGMINE METHYLSULFATE 10 MG/10ML IV SOLN
INTRAVENOUS | Status: DC | PRN
  Administered 2016-01-11: 3 mg via INTRAVENOUS

## 2016-01-11 MED ORDER — ONDANSETRON HCL 4 MG/2ML IJ SOLN
INTRAMUSCULAR | Status: DC | PRN
  Administered 2016-01-11: 4 mg via INTRAVENOUS

## 2016-01-11 MED ORDER — LIDOCAINE HCL (CARDIAC) 20 MG/ML IV SOLN
INTRAVENOUS | Status: DC | PRN
  Administered 2016-01-11: 50 mg via INTRAVENOUS

## 2016-01-11 MED ORDER — DEXAMETHASONE SODIUM PHOSPHATE 4 MG/ML IJ SOLN
INTRAMUSCULAR | Status: AC
  Filled 2016-01-11: qty 1

## 2016-01-11 MED ORDER — ONDANSETRON HCL 4 MG/2ML IJ SOLN: 4.0000 mg | Freq: Once | INTRAMUSCULAR | Status: DC | PRN

## 2016-01-11 MED ORDER — KETOROLAC TROMETHAMINE 30 MG/ML IJ SOLN: 30.0000 mg | Freq: Once | INTRAMUSCULAR | Status: DC

## 2016-01-11 MED ORDER — IBUPROFEN 100 MG/5ML PO SUSP
200.0000 mg | Freq: Four times a day (QID) | ORAL | Status: DC | PRN
  Filled 2016-01-11: qty 20

## 2016-01-11 MED ORDER — GLYCOPYRROLATE 0.2 MG/ML IJ SOLN
INTRAMUSCULAR | Status: DC | PRN
  Administered 2016-01-11: 0.4 mg via INTRAVENOUS

## 2016-01-11 MED ORDER — PROPOFOL 10 MG/ML IV BOLUS
INTRAVENOUS | Status: DC | PRN
  Administered 2016-01-11: 200 mg via INTRAVENOUS

## 2016-01-11 MED ORDER — SILVER NITRATE-POT NITRATE 75-25 % EX MISC
CUTANEOUS | Status: DC | PRN
  Administered 2016-01-11: 3

## 2016-01-11 MED ORDER — LACTATED RINGERS IV SOLN
INTRAVENOUS | Status: DC
  Administered 2016-01-11: 125 mL/h via INTRAVENOUS
  Administered 2016-01-11: 08:00:00 via INTRAVENOUS

## 2016-01-11 MED ORDER — SCOPOLAMINE 1 MG/3DAYS TD PT72
MEDICATED_PATCH | TRANSDERMAL | Status: AC
  Filled 2016-01-11: qty 1

## 2016-01-11 MED ORDER — PROPOFOL 10 MG/ML IV BOLUS
INTRAVENOUS | Status: AC
  Filled 2016-01-11: qty 20

## 2016-01-11 MED ORDER — IBUPROFEN 800 MG PO TABS: ORAL_TABLET | ORAL | Status: DC

## 2016-01-11 MED ORDER — FENTANYL CITRATE (PF) 100 MCG/2ML IJ SOLN
INTRAMUSCULAR | Status: DC | PRN
  Administered 2016-01-11: 100 ug via INTRAVENOUS
  Administered 2016-01-11: 50 ug via INTRAVENOUS

## 2016-01-11 MED ORDER — MIDAZOLAM HCL 2 MG/2ML IJ SOLN
INTRAMUSCULAR | Status: DC | PRN
  Administered 2016-01-11: 2 mg via INTRAVENOUS

## 2016-01-11 MED ORDER — HYDROCODONE-ACETAMINOPHEN 7.5-325 MG PO TABS: 1.0000 | ORAL_TABLET | Freq: Once | ORAL | Status: DC | PRN

## 2016-01-11 MED ORDER — MEPERIDINE HCL 25 MG/ML IJ SOLN: 6.2500 mg | INTRAMUSCULAR | Status: DC | PRN

## 2016-01-11 MED ORDER — GLYCOPYRROLATE 0.2 MG/ML IJ SOLN
INTRAMUSCULAR | Status: AC
  Filled 2016-01-11: qty 3

## 2016-01-11 MED ORDER — OXYCODONE-ACETAMINOPHEN 5-325 MG PO TABS: 1.0000 | ORAL_TABLET | ORAL | Status: DC | PRN

## 2016-01-11 MED ORDER — KETOROLAC TROMETHAMINE 30 MG/ML IJ SOLN
INTRAMUSCULAR | Status: DC | PRN
  Administered 2016-01-11: 30 mg via INTRAVENOUS

## 2016-01-11 MED ORDER — KETOROLAC TROMETHAMINE 30 MG/ML IJ SOLN
INTRAMUSCULAR | Status: AC
  Filled 2016-01-11: qty 1

## 2016-01-11 MED ORDER — BUPIVACAINE HCL (PF) 0.25 % IJ SOLN
INTRAMUSCULAR | Status: DC | PRN
  Administered 2016-01-11: 30 mL

## 2016-01-11 MED ORDER — MIDAZOLAM HCL 2 MG/2ML IJ SOLN
INTRAMUSCULAR | Status: AC
  Filled 2016-01-11: qty 2

## 2016-01-11 MED ORDER — LIDOCAINE HCL (PF) 1 % IJ SOLN
INTRAMUSCULAR | Status: AC
  Filled 2016-01-11: qty 5

## 2016-01-11 MED ORDER — IBUPROFEN 200 MG PO TABS
200.0000 mg | ORAL_TABLET | Freq: Four times a day (QID) | ORAL | Status: DC | PRN
  Filled 2016-01-11: qty 2

## 2016-01-11 MED ORDER — FENTANYL CITRATE (PF) 250 MCG/5ML IJ SOLN
INTRAMUSCULAR | Status: AC
  Filled 2016-01-11: qty 5

## 2016-01-11 MED ORDER — SILVER NITRATE-POT NITRATE 75-25 % EX MISC
CUTANEOUS | Status: AC
  Filled 2016-01-11: qty 1

## 2016-01-11 MED ORDER — GLYCOPYRROLATE 0.2 MG/ML IJ SOLN
INTRAMUSCULAR | Status: AC
Start: 2016-01-11 — End: 2016-01-11
  Filled 2016-01-11: qty 3

## 2016-01-11 MED ORDER — BUPIVACAINE HCL (PF) 0.25 % IJ SOLN
INTRAMUSCULAR | Status: AC
  Filled 2016-01-11: qty 30

## 2016-01-11 MED ORDER — SCOPOLAMINE 1 MG/3DAYS TD PT72
1.0000 | MEDICATED_PATCH | Freq: Once | TRANSDERMAL | Status: DC
  Administered 2016-01-11: 1.5 mg via TRANSDERMAL

## 2016-01-11 SURGICAL SUPPLY — 24 items
APPLICATOR COTTON TIP 6IN STRL (MISCELLANEOUS) ×3 IMPLANT
CATH ROBINSON RED A/P 16FR (CATHETERS) ×3 IMPLANT
CHLORAPREP W/TINT 26ML (MISCELLANEOUS) ×3 IMPLANT
CLOTH BEACON ORANGE TIMEOUT ST (SAFETY) ×3 IMPLANT
DILATOR CANAL MILEX (MISCELLANEOUS) IMPLANT
DRSG OPSITE POSTOP 3X4 (GAUZE/BANDAGES/DRESSINGS) ×2 IMPLANT
GLOVE BIO SURGEON STRL SZ7 (GLOVE) ×4 IMPLANT
GLOVE BIOGEL M 6.5 STRL (GLOVE) ×6 IMPLANT
GLOVE BIOGEL PI IND STRL 6.5 (GLOVE) ×1 IMPLANT
GLOVE BIOGEL PI IND STRL 7.0 (GLOVE) ×1 IMPLANT
GLOVE BIOGEL PI INDICATOR 6.5 (GLOVE) ×2
GLOVE BIOGEL PI INDICATOR 7.0 (GLOVE) ×2
GOWN STRL REUS W/TWL LRG LVL3 (GOWN DISPOSABLE) ×6 IMPLANT
LIQUID BAND (GAUZE/BANDAGES/DRESSINGS) ×3 IMPLANT
PACK LAPAROSCOPY BASIN (CUSTOM PROCEDURE TRAY) ×3 IMPLANT
PAD TRENDELENBURG POSITION (MISCELLANEOUS) ×3 IMPLANT
SLEEVE XCEL OPT CAN 5 100 (ENDOMECHANICALS) ×1 IMPLANT
SOLUTION ELECTROLUBE (MISCELLANEOUS) ×3 IMPLANT
SUT VICRYL 0 UR6 27IN ABS (SUTURE) ×3 IMPLANT
SUT VICRYL 4-0 PS2 18IN ABS (SUTURE) ×3 IMPLANT
TOWEL OR 17X24 6PK STRL BLUE (TOWEL DISPOSABLE) ×6 IMPLANT
TROCAR XCEL NON-BLD 11X100MML (ENDOMECHANICALS) ×3 IMPLANT
WARMER LAPAROSCOPE (MISCELLANEOUS) ×3 IMPLANT
WATER STERILE IRR 1000ML POUR (IV SOLUTION) ×3 IMPLANT

## 2016-01-11 NOTE — Discharge Instructions (Signed)

## 2016-01-11 NOTE — Op Note (Signed)
01/11/2016  8:38 AM  PATIENT:  Tricia McclintockAdrienne Gowin  40 y.o. female  PRE-OPERATIVE DIAGNOSIS:  Z30.8 Contraceptive Management  POST-OPERATIVE DIAGNOSIS:  Z30.8 Contraceptive Management  PROCEDURE:  Procedure(s): LAPAROSCOPIC BILATERAL TUBAL LIGATION (Bilateral)  SURGEON:  Surgeon(s) and Role:    * Gerald Leitzara Pratik Dalziel, MD - Primary  PHYSICIAN ASSISTANT:   ASSISTANTS: none   ANESTHESIA:   general  EBL:  Total I/O In: 1000 [I.V.:1000] Out: 55 [Urine:50; Blood:5]  BLOOD ADMINISTERED:none  DRAINS: none   LOCAL MEDICATIONS USED:  MARCAINE     SPECIMEN:  No Specimen  DISPOSITION OF SPECIMEN:  N/A  COUNTS:  YES  TOURNIQUET:  * No tourniquets in log *  DICTATION: .Dragon Dictation  PLAN OF CARE: Discharge to home after PACU  PATIENT DISPOSITION:  PACU - hemodynamically stable.   Delay start of Pharmacological VTE agent (>24hrs) due to surgical blood loss or risk of bleeding: not applicable  Procedure: The patient was taken to the operating room where she was placed under general anesthesia. She is placed in the dorsolithotomy position. She was prepped and draped in the usual sterile fashion. Speculum was placed into the vaginal vault. In the anterior lip of the cervix grasped with a single-tooth tenaculum. A uterine manipulator was placed without difficulty. The single-tooth tenaculum was  Removed. The speculum was removed.  Attention was turned to the umbilicus which was injected with 10 cc of quarter percent Marcaine. A 11 mm incision was made with the scalpel. A 11 mm trocar was placed under direct visualization. Entry into the peritoneum was visualized. The pneumoperitoneum was achieved with CO2 gas.  Her  fallopian tubes and ovaries appeared normal.  Operative scope was inserted and the right fallopian tube was identified and followed out to the fimbriated end. The a 2-3 cm portion of the right fallopian tube was cauterized with the Gyrus.  And incised  with laparoscopic scissors This  was repeated on the left fallopian tube The Gyrus was removed. A laparoscopic needle was inserted and 10 cc of quarter percent Marcaine was placed along the ampullary portion of the tubes  bilaterally. Pneumoperitoneum was released. The umbilical trocar was removed under direct visualization. The fascia was closed with 0 Vicryl. The skin was closed with 4-0 Vicryl. The dermabond placed a over the skin incision.  Patient was awakened from anesthesia taken to the recovery room awake and in stable condition.

## 2016-01-11 NOTE — Transfer of Care (Signed)
Immediate Anesthesia Transfer of Care Note  Patient: Tricia McclintockAdrienne Monnin  Procedure(s) Performed: Procedure(s): LAPAROSCOPIC BILATERAL TUBAL LIGATION (Bilateral)  Patient Location: PACU  Anesthesia Type:General  Level of Consciousness: awake, alert  and oriented  Airway & Oxygen Therapy: Patient Spontanous Breathing and Patient connected to nasal cannula oxygen  Post-op Assessment: Report given to RN and Post -op Vital signs reviewed and stable  Post vital signs: Reviewed and stable  Last Vitals:  Filed Vitals:   01/11/16 0619  BP: 117/77  Pulse: 70  Temp: 37.1 C  Resp: 16    Complications: No apparent anesthesia complications

## 2016-01-11 NOTE — Anesthesia Postprocedure Evaluation (Signed)
Anesthesia Post Note  Patient: Tricia Curry  Procedure(s) Performed: Procedure(s) (LRB): LAPAROSCOPIC BILATERAL TUBAL LIGATION (Bilateral)  Patient location during evaluation: PACU Anesthesia Type: General Level of consciousness: awake and alert Pain management: pain level controlled Vital Signs Assessment: post-procedure vital signs reviewed and stable Respiratory status: spontaneous breathing, nonlabored ventilation, respiratory function stable and patient connected to nasal cannula oxygen Cardiovascular status: blood pressure returned to baseline and stable Postop Assessment: no signs of nausea or vomiting Anesthetic complications: no    Last Vitals:  Filed Vitals:   01/11/16 0915 01/11/16 0930  BP: 97/67 97/62  Pulse: 55 55  Temp:  36.5 C  Resp: 25 15    Last Pain:  Filed Vitals:   01/11/16 1043  PainSc: 0-No pain                 Kennieth RadFitzgerald, Summers Buendia E

## 2016-01-11 NOTE — H&P (Signed)
Date of Initial H&P: 01/10/2016 History reviewed, patient examined, no change in status, stable for surgery.

## 2016-01-11 NOTE — Anesthesia Procedure Notes (Signed)
Procedure Name: Intubation Date/Time: 01/11/2016 7:35 AM Performed by: Cleda ClarksBROWDER, Tricia Curry Pre-anesthesia Checklist: Patient identified, Timeout performed, Emergency Drugs available, Suction available and Patient being monitored Patient Re-evaluated:Patient Re-evaluated prior to inductionOxygen Delivery Method: Circle system utilized Preoxygenation: Pre-oxygenation with 100% oxygen Intubation Type: IV induction Ventilation: Mask ventilation without difficulty Laryngoscope Size: Miller and 2 Grade View: Grade II Tube type: Oral Tube size: 7.0 mm Number of attempts: 1 Airway Equipment and Method: Stylet Placement Confirmation: ETT inserted through vocal cords under direct vision,  breath sounds checked- equal and bilateral and positive ETCO2 Secured at: 21 cm Tube secured with: Tape Dental Injury: Teeth and Oropharynx as per pre-operative assessment

## 2016-01-12 ENCOUNTER — Encounter (HOSPITAL_COMMUNITY): Payer: Self-pay | Admitting: Obstetrics and Gynecology

## 2016-04-24 ENCOUNTER — Other Ambulatory Visit (HOSPITAL_COMMUNITY)
Admission: RE | Admit: 2016-04-24 | Discharge: 2016-04-24 | Disposition: A | Discharge status: Home / Self Care | Payer: Medicaid Other | Source: Ambulatory Visit | Attending: Obstetrics and Gynecology | Admitting: Obstetrics and Gynecology

## 2016-04-24 ENCOUNTER — Other Ambulatory Visit: Payer: Self-pay | Admitting: Obstetrics and Gynecology

## 2016-04-24 DIAGNOSIS — Z1151 Encounter for screening for human papillomavirus (HPV): Secondary | ICD-10-CM | POA: Insufficient documentation

## 2016-04-24 DIAGNOSIS — Z01419 Encounter for gynecological examination (general) (routine) without abnormal findings: Secondary | ICD-10-CM | POA: Insufficient documentation

## 2016-04-25 LAB — CYTOLOGY - PAP

## 2017-10-31 ENCOUNTER — Ambulatory Visit: Payer: Self-pay

## 2018-03-05 DIAGNOSIS — I951 Orthostatic hypotension: Secondary | ICD-10-CM | POA: Diagnosis not present

## 2018-03-05 DIAGNOSIS — R631 Polydipsia: Secondary | ICD-10-CM | POA: Diagnosis not present

## 2018-04-05 DIAGNOSIS — J45909 Unspecified asthma, uncomplicated: Secondary | ICD-10-CM | POA: Insufficient documentation

## 2018-04-05 DIAGNOSIS — R42 Dizziness and giddiness: Secondary | ICD-10-CM | POA: Diagnosis not present

## 2018-04-05 DIAGNOSIS — R51 Headache: Secondary | ICD-10-CM | POA: Insufficient documentation

## 2018-04-05 DIAGNOSIS — Z91010 Allergy to peanuts: Secondary | ICD-10-CM | POA: Diagnosis not present

## 2018-04-05 DIAGNOSIS — Z3202 Encounter for pregnancy test, result negative: Secondary | ICD-10-CM | POA: Diagnosis not present

## 2018-04-06 ENCOUNTER — Other Ambulatory Visit: Payer: Self-pay

## 2018-04-06 ENCOUNTER — Emergency Department (HOSPITAL_COMMUNITY)
Admission: EM | Admit: 2018-04-06 | Discharge: 2018-04-06 | Disposition: A | Discharge status: Home / Self Care | Payer: Medicaid Other | Attending: Emergency Medicine | Admitting: Emergency Medicine

## 2018-04-06 DIAGNOSIS — R42 Dizziness and giddiness: Secondary | ICD-10-CM

## 2018-04-06 LAB — I-STAT CHEM 8, ED
BUN: 8 mg/dL (ref 6–20)
CALCIUM ION: 1.1 mmol/L — AB (ref 1.15–1.40)
CREATININE: 0.7 mg/dL (ref 0.44–1.00)
Chloride: 104 mmol/L (ref 101–111)
Glucose, Bld: 80 mg/dL (ref 65–99)
HCT: 35 % — ABNORMAL LOW (ref 36.0–46.0)
HEMOGLOBIN: 11.9 g/dL — AB (ref 12.0–15.0)
Potassium: 4.1 mmol/L (ref 3.5–5.1)
Sodium: 139 mmol/L (ref 135–145)
TCO2: 24 mmol/L (ref 22–32)

## 2018-04-06 LAB — POC URINE PREG, ED: Preg Test, Ur: NEGATIVE

## 2018-04-06 NOTE — ED Provider Notes (Signed)
[facility]-EMERGENCY DEPT Provider Note  CSN: [id number] Arrival date & time: 04/05/18 2300  Chief Complaint(s) Dizziness  HPI Tricia Curry is a 42 y.o. female   The history is provided by the patient.  Dizziness  Quality:  Vertigo ("swimmy headed") Severity:  Moderate Onset quality:  Sudden Duration: several weeks. Timing:  Intermittent Progression:  Resolved Context: bending over and head movement   Relieved by:  Being still Worsened by:  Turning head (and bending over) Associated symptoms: headaches (recurrent, "thumping" to bilateral temples. ), nausea and vision changes (had blurry vision with the lightheadedness, resolved)   Associated symptoms: no blood in stool, no chest pain, no diarrhea, no shortness of breath, no syncope, no vomiting and no weakness   Risk factors: anemia (recently Dx'd with anemia and started on Fe-)   Risk factors: no multiple medications    Past Medical History Past Medical History:  Diagnosis Date  . Asthma    Patient Active Problem List   Diagnosis Date Noted  . Dehydration 05/03/2014   Home Medication(s) Prior to Admission medications   Medication Sig Start Date End Date Taking? Authorizing Provider  albuterol (PROVENTIL HFA;VENTOLIN HFA) 108 (90 BASE) MCG/ACT inhaler Inhale 1-2 puffs into the lungs every 6 (six) hours as needed for wheezing or shortness of breath.    [provider]  diphenhydrAMINE (BENADRYL) 25 mg capsule Take 25 mg by mouth daily as needed for allergies.    [provider]  ibuprofen (ADVIL,MOTRIN) 800 MG tablet 1 po every 8 hours as needed for mild to moderate pain 01/11/16   Gerald Leitz, MD  olopatadine (PATANOL) 0.1 % ophthalmic solution Place 1 drop into both eyes daily as needed for allergies.    [provider]  oxyCODONE-acetaminophen (ROXICET) 5-325 MG tablet Take 1-2 tablets by mouth every 4 (four) hours as needed for severe pain. 01/11/16   Gerald Leitz, MD                                                                                                                               Past Surgical History Past Surgical History:  Procedure Laterality Date  . LAPAROSCOPIC TUBAL LIGATION Bilateral 01/11/2016   Procedure: LAPAROSCOPIC BILATERAL TUBAL LIGATION;  Surgeon: Gerald Leitz, MD;  Location: [facility];  Service: Gynecology;  Laterality: Bilateral;   Family History Family History  Problem Relation Age of Onset  . Stroke Maternal Aunt     Social History Social History   Tobacco Use  . Smoking status: Never Smoker  . Smokeless tobacco: Never Used  Substance Use Topics  . Alcohol use: Yes    Alcohol/week: 1.2 oz    Types: 2 Cans of beer per week  . Drug use: No   Allergies Peanut-containing drug products  Review of Systems Review of Systems  Respiratory: Negative for shortness of breath.   Cardiovascular: Negative for chest pain and syncope.  Gastrointestinal: Positive for nausea. Negative for blood  in stool, diarrhea and vomiting.  Neurological: Positive for dizziness and headaches (recurrent, "thumping" to bilateral temples. ). Negative for weakness.   All other systems are reviewed and are negative for acute change except as noted in the HPI  Physical Exam Vital Signs  I have reviewed the triage vital signs BP 125/72 (BP Location: Right Arm)   Pulse 72   Temp 98.8 F (37.1 C) (Oral)   Resp 14   Ht 5\' 6"  (1.676 m)   Wt 114.8 kg (253 lb)   LMP 03/31/2018 (Approximate)   SpO2 100%   BMI 40.84 kg/m   Physical Exam  Constitutional: She is oriented to person, place, and time. She appears well-developed and well-nourished. No distress.  HENT:  Head: Normocephalic and atraumatic.  Nose: Nose normal.  Eyes: Pupils are equal, round, and reactive to light. Conjunctivae and EOM are normal. Right eye exhibits no discharge. Left eye exhibits no discharge. No scleral icterus.  Neck: Normal range of motion. Neck supple.  Cardiovascular:  Normal rate and regular rhythm. Exam reveals no gallop and no friction rub.  No murmur heard. Pulmonary/Chest: Effort normal and breath sounds normal. No stridor. No respiratory distress. She has no rales.  Abdominal: Soft. She exhibits no distension. There is no tenderness.  Musculoskeletal: She exhibits no edema or tenderness.  Neurological: She is alert and oriented to person, place, and time.  Mental Status:  Alert and oriented to person, place, and time.  Attention and concentration normal.  Speech clear.  Recent memory is intact  Cranial Nerves:  II Visual Fields: Intact to confrontation. Visual fields intact. III, IV, VI: Pupils equal and reactive to light and near. Full eye movement without nystagmus  V Facial Sensation: Normal. No weakness of masticatory muscles  VII: No facial weakness or asymmetry  VIII Auditory Acuity: Grossly normal  IX/X: The uvula is midline; the palate elevates symmetrically  XI: Normal sternocleidomastoid and trapezius strength  XII: The tongue is midline. No atrophy or fasciculations.   Motor System: Muscle Strength: 5/5 and symmetric in the upper and lower extremities. No pronation or drift.  Muscle Tone: Tone and muscle bulk are normal in the upper and lower extremities.   Reflexes: DTRs: 1+ and symmetrical in all four extremities. No Clonus Coordination: Intact finger-to-nose, heel-to-shin. No tremor.  Sensation: Intact to light touch, and pinprick.  Gait: Routine gait normal.   Skin: Skin is warm and dry. No rash noted. She is not diaphoretic. No erythema.  Psychiatric: She has a normal mood and affect.  Vitals reviewed.   ED Results and Treatments Labs (all labs ordered are listed, but only abnormal results are displayed) Labs Reviewed  I-STAT CHEM 8, ED - Abnormal; Notable for the following components:      Result Value   Calcium, Ion 1.10 (*)    Hemoglobin 11.9 (*)    HCT 35.0 (*)    All other components within normal limits  POC  URINE PREG, ED  EKG  EKG Interpretation  Date/Time:    Ventricular Rate:    PR Interval:    QRS Duration:   QT Interval:    QTC Calculation:   R Axis:     Text Interpretation:        Radiology No results found. Pertinent labs & imaging results that were available during my care of the patient were reviewed by me and considered in my medical decision making (see chart for details).  Medications Ordered in ED Medications - No data to display                                                                                                                                  Procedures Procedures  (including critical care time)  Medical Decision Making / ED Course I have reviewed the nursing notes for this encounter and the patient's prior records (if available in EHR or on provided paperwork).    Orthostatics reassuring.  Screening labs without significant electrolyte derangement.  Hemoglobin 11.9.  UPT negative.  During orthostatics patient they report that she became mildly symptomatic while standing up for prolonged period of time.  Presentation not consistent with central process.  Recommended follow-up with primary care provider.  The patient appears reasonably screened and/or stabilized for discharge and I doubt any other medical condition or other [facility] requiring further screening, evaluation, or treatment in the ED at this time prior to discharge.  The patient is safe for discharge with strict return precautions.   Final Clinical Impression(s) / ED Diagnoses Final diagnoses:  Dizziness    Disposition: Discharge  Condition: Good  I have discussed the results, Dx and Tx plan with the patient who expressed understanding and agree(s) with the plan. Discharge instructions discussed at great length. The patient was given strict return precautions who  verbalized understanding of the instructions. No further questions at time of discharge.    ED Discharge Orders    None       Follow Up: Primary care provider  Schedule an appointment as soon as possible for a visit  As needed     This chart was dictated using voice recognition software.  Despite best efforts to proofread,  errors can occur which can change the documentation meaning.   Nira Connardama, Pedro Eduardo, MD 04/06/18 [phone]

## 2018-04-06 NOTE — ED Notes (Signed)
ED Provider at bedside. 

## 2018-04-06 NOTE — ED Triage Notes (Signed)
Pt arriving with dizziness and blurred vision. Pt states the dizziness has been present "for a while" but the blurred vision is new. Pt also having severe headache. Pt reports seeing PCP and having blood work done but she is waiting on results to come back.

## 2018-04-28 ENCOUNTER — Other Ambulatory Visit: Payer: Self-pay | Admitting: Internal Medicine

## 2018-04-28 DIAGNOSIS — Z1231 Encounter for screening mammogram for malignant neoplasm of breast: Secondary | ICD-10-CM

## 2018-05-20 ENCOUNTER — Ambulatory Visit: Payer: Medicaid Other

## 2018-05-26 ENCOUNTER — Ambulatory Visit
Admission: RE | Admit: 2018-05-26 | Discharge: 2018-05-26 | Disposition: A | Discharge status: Home / Self Care | Payer: Medicaid Other | Source: Ambulatory Visit | Attending: Internal Medicine | Admitting: Internal Medicine

## 2018-05-26 DIAGNOSIS — Z1231 Encounter for screening mammogram for malignant neoplasm of breast: Secondary | ICD-10-CM | POA: Diagnosis not present

## 2018-10-02 ENCOUNTER — Ambulatory Visit: Payer: Self-pay | Admitting: Internal Medicine

## 2018-12-31 ENCOUNTER — Ambulatory Visit: Payer: Medicaid Other | Admitting: Internal Medicine

## 2018-12-31 ENCOUNTER — Other Ambulatory Visit: Payer: Self-pay

## 2018-12-31 ENCOUNTER — Encounter: Payer: Self-pay | Admitting: Internal Medicine

## 2018-12-31 VITALS — BP 134/80 | Temp 98.5°F

## 2018-12-31 DIAGNOSIS — H8301 Labyrinthitis, right ear: Secondary | ICD-10-CM | POA: Diagnosis not present

## 2018-12-31 DIAGNOSIS — Z820 Family history of epilepsy and other diseases of the nervous system: Secondary | ICD-10-CM

## 2018-12-31 DIAGNOSIS — G44019 Episodic cluster headache, not intractable: Secondary | ICD-10-CM

## 2018-12-31 MED ORDER — MECLIZINE HCL 25 MG PO TABS: ORAL_TABLET | ORAL | 0 refills | Status: DC

## 2018-12-31 NOTE — Patient Instructions (Signed)
Labyrinthitis    Labyrinthitis is an infection of the inner ear. Your inner ear is made up of tubes and canals (labyrinth). These are filled with fluid. There are nerve cells in your inner ear that send hearing and balance signals to your brain. When germs get inside the tubes and canals, they harm the nerve cells that send signals to the brain.  This condition is often caused by a virus. You may have:   Dizziness.   Ringing in the ears.   Loss of hearing.   Loss of balance.   Stomach upset.   Throwing up.  Follow these instructions at home:  Medicines     Take over-the-counter and prescription medicines only as told by your doctor.   If you were given an antibiotic medicine, take it as told by your doctor. Do not stop taking the antibiotic even if you start to feel better.  Activity   Rest as told by your doctor.   Limit the things you do as told. Ask your doctor what is safe for you to do.   Do not make any sudden movements until you no longer feel dizzy.   Do physical therapy as told by your doctor.  General instructions   Avoid loud noises and bright lights.   Do not drive until your doctor says that it is safe for you.   Drink enough fluid to keep your pee (urine) pale yellow.   Keep all follow-up visits as told by your doctor. This is important.  Contact a doctor if:   Your symptoms do not get better.   You do not get better after two weeks.   You have a fever.  Get help right away if:   You are very dizzy.   You keep throwing up.   You keep feeling sick to your stomach.   Your hearing gets much worse all of a sudden.  Summary   Labyrinthitis is an infection of the inner ear.   This condition is often caused by a virus. Symptoms include dizziness, hearing loss, and ringing in the ears.   Treatment depends on the cause. Follow what your doctor tells you.  This information is not intended to replace advice given to you by your health care provider. Make sure you discuss any questions  you have with your health care provider.  Document Released: 10/01/2005 Document Revised: 10/12/2017 Document Reviewed: 10/12/2017  Elsevier Interactive Patient Education  2019 [employer] Inc[employer]

## 2018-12-31 NOTE — Progress Notes (Signed)
Subjective:     Patient ID: Tricia Curry , female    DOB: [birth year] , 43 y.o.   MRN: [id number]   Chief Complaint  Patient presents with  . Dizziness    headache/ ear pain/ eye discomfort     HPI  This weekend noticed she had slight vertigo and it is still mild, and today noticed her R ear is ringing and HA on her R forehead. She also noticed the light is bothering her eyes. There is family Hx of migraines, but she has never had them.  Denies trouble hearing from the R side. She has not tried anything for her HA.   Past Medical History:  Diagnosis Date  . Asthma      Family History  Problem Relation Age of Onset  . Stroke Maternal Aunt   . Breast cancer Sister   . Hypertension Mother      Current Outpatient Medications:  .  diphenhydrAMINE (BENADRYL) 25 mg capsule, Take 25 mg by mouth daily as needed for allergies., Disp: , Rfl:  .  ibuprofen (ADVIL,MOTRIN) 800 MG tablet, 1 po every 8 hours as needed for mild to moderate pain, Disp: 30 tablet, Rfl: 1 .  olopatadine (PATANOL) 0.1 % ophthalmic solution, Place 1 drop into both eyes daily as needed for allergies., Disp: , Rfl:  .  albuterol (PROVENTIL HFA;VENTOLIN HFA) 108 (90 BASE) MCG/ACT inhaler, Inhale 1-2 puffs into the lungs every 6 (six) hours as needed for wheezing or shortness of breath., Disp: , Rfl:  .  oxyCODONE-acetaminophen (ROXICET) 5-325 MG tablet, Take 1-2 tablets by mouth every 4 (four) hours as needed for severe pain. (Patient not taking: Reported on 12/31/2018), Disp: 30 tablet, Rfl: 0   Allergies  Allergen Reactions  . Peanut-Containing Drug Products Anaphylaxis     Review of Systems  Constitutional: Negative for chills, diaphoresis and fever.  HENT: Negative for congestion, ear discharge, ear pain, postnasal drip, rhinorrhea, sinus pressure, sinus pain, sneezing and sore throat.   Eyes: Positive for photophobia. Negative for discharge.       Has mild photophobia  Respiratory: Negative for cough and  shortness of breath.   Cardiovascular: Negative for chest pain and palpitations.  Gastrointestinal: Negative for nausea.  Musculoskeletal: Negative for myalgias.  Skin: Negative for rash.  Neurological: Positive for dizziness and headaches. Negative for numbness.  Hematological: Negative for adenopathy.     Today's Vitals   12/31/18 1201 12/31/18 1206  BP:  134/80  Temp: 98.5 F (36.9 C)   TempSrc: Oral   SpO2: 98%    There is no height or weight on file to calculate BMI.   Objective:  Physical Exam  Alert, well kept in NAD EYES- PERRLA, EOMI, non icterus, conjunctiva's are normal.  ENT- external ears and nose are normal, L TM  gray and shiny, L gray and dull on upper portion, Nose with clear mucous and            Mild congestion bilaterally. Throat- clear. No sinus tenderness.  NECK- supple with no nodes LUNG- clear, with no wheezing, rhonchi or rales.  HEART- RRR with no murmurs. No carotid bruits heard.  SKIN- non jaundiced, no rashes or ecchymosis.  Neurological:     Mental Status: He is alert.     Cranial Nerves: No cranial nerve deficit or facial asymmetry.     Sensory: No sensory deficit.     Motor: No weakness.     Coordination: Romberg sign negative. Coordination normal.  Gait: Gait normal.     Deep Tendon Reflexes: Reflexes normal.     Comments: Normal Romberg   Psychiatric:        Mood and Affect: Mood normal.        Speech: Speech normal.        Behavior: Behavior normal.   Assessment And Plan:    1. Labyrinthitis of right ear- acute. I placed her on Meclezine 25 mg 1/2 to 1 tid x 7 days.  2- HA- advised to try Excedrin  FU prn  Jaymar Loeber RODRIGUEZ-SOUTHWORTH, PA-C

## 2019-01-19 ENCOUNTER — Emergency Department (HOSPITAL_COMMUNITY): Payer: Medicaid Other

## 2019-01-19 ENCOUNTER — Encounter (HOSPITAL_COMMUNITY): Payer: Self-pay | Admitting: *Deleted

## 2019-01-19 ENCOUNTER — Other Ambulatory Visit: Payer: Self-pay

## 2019-01-19 ENCOUNTER — Emergency Department (HOSPITAL_COMMUNITY)
Admission: EM | Admit: 2019-01-19 | Discharge: 2019-01-19 | Disposition: A | Discharge status: Home / Self Care | Payer: Medicaid Other | Attending: Emergency Medicine | Admitting: Emergency Medicine

## 2019-01-19 DIAGNOSIS — D649 Anemia, unspecified: Secondary | ICD-10-CM | POA: Diagnosis not present

## 2019-01-19 DIAGNOSIS — R002 Palpitations: Secondary | ICD-10-CM | POA: Diagnosis not present

## 2019-01-19 DIAGNOSIS — Z79899 Other long term (current) drug therapy: Secondary | ICD-10-CM | POA: Insufficient documentation

## 2019-01-19 DIAGNOSIS — J45909 Unspecified asthma, uncomplicated: Secondary | ICD-10-CM | POA: Diagnosis not present

## 2019-01-19 DIAGNOSIS — R079 Chest pain, unspecified: Secondary | ICD-10-CM | POA: Diagnosis not present

## 2019-01-19 DIAGNOSIS — R0602 Shortness of breath: Secondary | ICD-10-CM | POA: Diagnosis not present

## 2019-01-19 LAB — CBC
HCT: 35.6 % — ABNORMAL LOW (ref 36.0–46.0)
Hemoglobin: 10.9 g/dL — ABNORMAL LOW (ref 12.0–15.0)
MCH: 26 pg (ref 26.0–34.0)
MCHC: 30.6 g/dL (ref 30.0–36.0)
MCV: 84.8 fL (ref 80.0–100.0)
Platelets: 332 10*3/uL (ref 150–400)
RBC: 4.2 MIL/uL (ref 3.87–5.11)
RDW: 14 % (ref 11.5–15.5)
WBC: 6.9 10*3/uL (ref 4.0–10.5)
nRBC: 0 % (ref 0.0–0.2)

## 2019-01-19 LAB — BASIC METABOLIC PANEL
Anion gap: 8 (ref 5–15)
BUN: 8 mg/dL (ref 6–20)
CO2: 26 mmol/L (ref 22–32)
Calcium: 8.5 mg/dL — ABNORMAL LOW (ref 8.9–10.3)
Chloride: 102 mmol/L (ref 98–111)
Creatinine, Ser: 0.68 mg/dL (ref 0.44–1.00)
GFR calc Af Amer: >60 mL/min (ref >60)
GFR calc non Af Amer: >60 mL/min (ref >60)
Glucose, Bld: 81 mg/dL (ref 70–99)
Potassium: 3.7 mmol/L (ref 3.5–5.1)
Sodium: 136 mmol/L (ref 135–145)

## 2019-01-19 LAB — I-STAT BETA HCG BLOOD, ED (MC, WL, AP ONLY): I-stat hCG, quantitative: <5 m[IU]/mL (ref <5)

## 2019-01-19 LAB — TROPONIN I: Troponin I: <0.03 ng/mL (ref <0.03)

## 2019-01-19 MED ORDER — SODIUM CHLORIDE 0.9% FLUSH: 3.0000 mL | Freq: Once | INTRAVENOUS | Status: DC

## 2019-01-19 NOTE — Discharge Instructions (Addendum)
Your hemoglobin was 10.9 today.  Please get rechecked immediately if you develop difficulty breathing, chest pain, or new concerning symptoms.    Avoid caffeine and alcohol.

## 2019-01-19 NOTE — ED Triage Notes (Signed)
Pt reports having intermittent abd/rectal for extended time and now has palpitations. No acute distress is noted at triage.

## 2019-01-19 NOTE — ED Provider Notes (Signed)
[facility] EMERGENCY DEPARTMENT Provider Note   CSN: [id number] Arrival date & time: 01/19/19  1324    History   Chief Complaint Chief Complaint  Patient presents with  . Palpitations    HPI Tricia Curry is a 43 y.o. female.     The history is provided by the patient and medical records. No language interpreter was used.  Palpitations   Tricia Curry is a 43 y.o. female who presents to the Emergency Department complaining of palpitations.  Sxs have been going on for about a month.  Sxs come and go and are worse when she is at home and at rest.  She does have swelling in her legs/hands for the last year that she attributes to excess salt.   She has experienced weight gain recently. Denies any chest pain, fevers, night sweats, abdominal pain, vomiting, difficulty breathing. She does have a family doctor and has routine evaluations. She has no known medical problems and takes no medications. She did have her thyroid checked on her last doctor visit. She denies any tobacco use. She drinks occasional alcohol, no street drugs. She drinks very little caffeine. Past Medical History:  Diagnosis Date  . Asthma     Patient Active Problem List   Diagnosis Date Noted  . Dehydration 05/03/2014    Past Surgical History:  Procedure Laterality Date  . LAPAROSCOPIC TUBAL LIGATION Bilateral 01/11/2016   Procedure: LAPAROSCOPIC BILATERAL TUBAL LIGATION;  Surgeon: Gerald Leitz, MD;  Location: [facility];  Service: Gynecology;  Laterality: Bilateral;     OB History   No obstetric history on file.      Home Medications    Prior to Admission medications   Medication Sig Start Date End Date Taking? Authorizing Provider  albuterol (PROVENTIL HFA;VENTOLIN HFA) 108 (90 BASE) MCG/ACT inhaler Inhale 1-2 puffs into the lungs every 6 (six) hours as needed for wheezing or shortness of breath.    [provider]  diphenhydrAMINE (BENADRYL) 25 mg capsule Take 25 mg by mouth  daily as needed for allergies.    [provider]  ibuprofen (ADVIL,MOTRIN) 800 MG tablet 1 po every 8 hours as needed for mild to moderate pain 01/11/16   Gerald Leitz, MD  meclizine (ANTIVERT) 25 MG tablet 1/2 to 1 tid x 7 days for dizziness 12/31/18   Rodriguez-Southworth, Nettie Elm, PA-C  olopatadine (PATANOL) 0.1 % ophthalmic solution Place 1 drop into both eyes daily as needed for allergies.    [provider]  oxyCODONE-acetaminophen (ROXICET) 5-325 MG tablet Take 1-2 tablets by mouth every 4 (four) hours as needed for severe pain. Patient not taking: Reported on 12/31/2018 01/11/16   Gerald Leitz, MD    Family History Family History  Problem Relation Age of Onset  . Stroke Maternal Aunt   . Breast cancer Sister   . Hypertension Mother     Social History Social History   Tobacco Use  . Smoking status: Never Smoker  . Smokeless tobacco: Never Used  Substance Use Topics  . Alcohol use: Yes    Alcohol/week: 2.0 standard drinks    Types: 2 Cans of beer per week  . Drug use: No     Allergies   Peanut-containing drug products   Review of Systems Review of Systems  Cardiovascular: Positive for palpitations.  All other systems reviewed and are negative.    Physical Exam Updated Vital Signs BP 125/83 (BP Location: Right Arm)   Pulse 78   Temp 98.7 F (37.1 C) (Oral)  Resp 18   LMP 01/14/2019   SpO2 100%   Physical Exam Vitals signs and nursing note reviewed.  Constitutional:      Appearance: She is well-developed.  HENT:     Head: Normocephalic and atraumatic.  Cardiovascular:     Rate and Rhythm: Normal rate and regular rhythm.     Heart sounds: No murmur.  Pulmonary:     Effort: Pulmonary effort is normal. No respiratory distress.     Breath sounds: Normal breath sounds.  Abdominal:     Palpations: Abdomen is soft.     Tenderness: There is no abdominal tenderness. There is no guarding or rebound.  Musculoskeletal:        General: No swelling  or tenderness.  Skin:    General: Skin is warm and dry.  Neurological:     Mental Status: She is alert and oriented to person, place, and time.  Psychiatric:        Mood and Affect: Mood normal.        Behavior: Behavior normal.      ED Treatments / Results  Labs (all labs ordered are listed, but only abnormal results are displayed) Labs Reviewed  BASIC METABOLIC PANEL - Abnormal; Notable for the following components:      Result Value   Calcium 8.5 (*)    All other components within normal limits  CBC - Abnormal; Notable for the following components:   Hemoglobin 10.9 (*)    HCT 35.6 (*)    All other components within normal limits  TROPONIN I  I-STAT BETA HCG BLOOD, ED ([facility], [facility], [facility] ONLY)    EKG EKG Interpretation  Date/Time:  Monday January 19 2019 13:04:55 EDT Ventricular Rate:  70 PR Interval:  172 QRS Duration: 78 QT Interval:  396 QTC Calculation: 427 R Axis:   32 Text Interpretation:  Normal sinus rhythm with sinus arrhythmia Low voltage QRS Cannot rule out Anterior infarct , age undetermined Abnormal ECG No significant change since last tracing Confirmed by Tilden Fossa [phone]) on 01/19/2019 2:51:03 PM   Radiology Dg Chest 2 View  Result Date: 01/19/2019 CLINICAL DATA:  Palpitations for 1 month, no chest pain, no shortness of breath EXAM: CHEST - 2 VIEW COMPARISON:  CT chest 05/03/2014 FINDINGS: The heart size and mediastinal contours are within normal limits. Both lungs are clear. The visualized skeletal structures are unremarkable. IMPRESSION: No active cardiopulmonary disease. Electronically Signed   By: Elige Ko   On: 01/19/2019 14:27    Procedures Procedures (including critical care time)  Medications Ordered in ED Medications  sodium chloride flush (NS) 0.9 % injection 3 mL (has no administration in time range)     Initial Impression / Assessment and Plan / ED Course  I have reviewed the triage vital signs and the nursing notes.  Pertinent  labs & imaging results that were available during my care of the patient were reviewed by me and considered in my medical decision making (see chart for details).        Patient presents to the emergency department complaining of palpitations for the last month. She is well appearing on examination and in no acute distress. Presentation is not consistent with PE, CHF, pneumonia, ACS. EKG with sinus rhythm and sinus arrhythmia. Labs are significant for anemia, stable when compared to priors. She does have a history of menorrhagia. Discussed with patient continuing her home iron supplementation. Discussed outpatient follow-up as well as return precautions.  Final Clinical Impressions(s) / ED  Diagnoses   Final diagnoses:  Palpitations  Anemia, unspecified type    ED Discharge Orders    None       Tilden Fossaees, Emelina Hinch, MD 01/19/19 1536

## 2019-01-27 ENCOUNTER — Encounter: Payer: Self-pay | Admitting: Nurse Practitioner

## 2019-01-27 ENCOUNTER — Other Ambulatory Visit: Payer: Self-pay

## 2019-01-27 ENCOUNTER — Ambulatory Visit: Payer: Medicaid Other | Admitting: Nurse Practitioner

## 2019-01-27 VITALS — BP 122/77 | HR 89 | Wt 262.0 lb

## 2019-01-27 DIAGNOSIS — D508 Other iron deficiency anemias: Secondary | ICD-10-CM | POA: Diagnosis not present

## 2019-01-27 DIAGNOSIS — R002 Palpitations: Secondary | ICD-10-CM | POA: Insufficient documentation

## 2019-01-27 NOTE — Progress Notes (Signed)
This visit type was conducted due to national recommendations for restrictions regarding the COVID-19 Pandemic (e.g. social distancing). This format is felt to be most appropriate for this patient at this time.  All issues noted in this document were discussed and addressed.  No physical exam was performed (except for noted visual exam findings with Video Visits).  Please refer to the patient's chart (MyChart message for video visits and phone note for telephone visits) for the patient's consent [facility].   Subjective:     Patient ID: Tricia McclintockAdrienne Curry , female    DOB: 08-04-1976 , 43 y.o.   MRN: [id number]  Virtual Visit via Video Note  I connected with Tricia Curry on 01/27/19 at  2:45 PM EDT by telephone and verified that I am speaking with the correct person using two identifiers.   I discussed the limitations, risks, security and privacy concerns of performing an evaluation and management service by telephone and the availability of in person appointments. I also discussed with the patient that there may be a patient responsible charge related to this service. The patient expressed understanding and agreed to proceed.  Patient Location:  Home Provider Location: Home  Chief Complaint  Patient presents with  . Follow-up    Er- anemia- thyroid-palpatations    History of Present Illness:   She was drinking cappucinno's since going to the ER.  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    She has recently had a bad break up earlier in the year.  She is now taking 2 iron supplements and MVI daily and has helped her feel better.    Palpitations   This is a new problem. The current episode started in the past 7 days. The problem occurs 2 to 4 times per day. The problem has been resolved. Pertinent negatives include no anxiety, chest pain, coughing or shortness of breath. Her past medical history is significant  for anemia.     Past Medical History:  Diagnosis Date  . Asthma      Family History  Problem Relation Age of Onset  . Stroke Maternal Aunt   . Breast cancer Sister   . Hypertension Mother      Current Outpatient Medications:  .  albuterol (PROVENTIL HFA;VENTOLIN HFA) 108 (90 BASE) MCG/ACT inhaler, Inhale 1-2 puffs into the lungs every 6 (six) hours as needed for wheezing or shortness of breath., Disp: , Rfl:  .  diphenhydrAMINE (BENADRYL) 25 mg capsule, Take 25 mg by mouth daily as needed for allergies., Disp: , Rfl:  .  ibuprofen (ADVIL,MOTRIN) 800 MG tablet, 1 po every 8 hours as needed for mild to moderate pain, Disp: 30 tablet, Rfl: 1 .  meclizine (ANTIVERT) 25 MG tablet, 1/2 to 1 tid x 7 days for dizziness, Disp: 21 tablet, Rfl: 0 .  olopatadine (PATANOL) 0.1 % ophthalmic solution, Place 1 drop into both eyes daily as needed for allergies., Disp: , Rfl:  .  oxyCODONE-acetaminophen (ROXICET) 5-325 MG tablet, Take 1-2 tablets by mouth every 4 (four) hours as needed for severe pain. (Patient not taking: Reported on 12/31/2018), Disp: 30 tablet, Rfl: 0   Allergies  Allergen Reactions  . Peanut-Containing Drug Products Anaphylaxis     Review of Systems  Respiratory: Negative for cough and shortness of breath.   Cardiovascular: Positive for palpitations. Negative for chest pain.  Psychiatric/Behavioral: The patient is not nervous/anxious.      Today's  Vitals   01/27/19 1452  BP: 122/77  Pulse: 89  Weight: 262 lb (118.8 kg)    Observations/Objective: No acute distress noted.  Skin appears in normal color.        Assessment and Plan: 1. Palpitations  Improving since going to the ER, she has decreased her intake of caffeine  I have encouraged her to increase her water intake  Work up in ER did not reveal a cardiac cause  Advised if reoccurs to return to office may need to send for more in depth cardiac work up  2. Iron deficiency anemia secondary to inadequate  dietary iron intake  She is now taking 2 MVIs with iron and feels this has improved  3. Class 3 severe obesity without serious comorbidity in adult, unspecified BMI, unspecified obesity type (HCC)  After recent break up feels she has had weight gain  Encouraged to increase physical activity to at least 150 minutes per week.  Encouraged to look on Youtube for exercises that may be given by trainers due to being in Quarantine   Follow Up Instructions:   I discussed the assessment and treatment plan with the patient. The patient was provided an opportunity to ask questions and all were answered. The patient agreed with the plan and demonstrated an understanding of the instructions.   The patient was advised to call back or seek an in-person evaluation if the symptoms worsen or if the condition fails to improve as anticipated.  COVID-19 Education: The signs and symptoms of COVID-19 were discussed with the patient and how to seek care for testing (follow up with PCP or arrange E-visit).  The importance of social distancing was discussed today.   Patient Risk:   After full review of this patients clinical status, I feel that they are at least moderate risk at this time.   I provided 16 minutes of non-face-to-face time during this encounter.   Arnette Felts, FNP

## 2019-02-08 ENCOUNTER — Encounter: Payer: Self-pay | Admitting: Nurse Practitioner

## 2019-04-09 ENCOUNTER — Encounter: Payer: Self-pay | Admitting: Internal Medicine

## 2019-04-14 ENCOUNTER — Other Ambulatory Visit: Payer: Self-pay

## 2019-04-14 ENCOUNTER — Other Ambulatory Visit (HOSPITAL_COMMUNITY)
Admission: RE | Admit: 2019-04-14 | Discharge: 2019-04-14 | Disposition: A | Discharge status: Home / Self Care | Payer: Medicaid Other | Source: Ambulatory Visit | Attending: Internal Medicine | Admitting: Internal Medicine

## 2019-04-14 ENCOUNTER — Ambulatory Visit: Payer: Medicaid Other | Admitting: Nurse Practitioner

## 2019-04-14 ENCOUNTER — Encounter: Payer: Self-pay | Admitting: Nurse Practitioner

## 2019-04-14 VITALS — BP 120/84 | HR 86 | Temp 98.8°F | Ht 66.8 in | Wt 269.0 lb

## 2019-04-14 DIAGNOSIS — Z1239 Encounter for other screening for malignant neoplasm of breast: Secondary | ICD-10-CM

## 2019-04-14 DIAGNOSIS — Z113 Encounter for screening for infections with a predominantly sexual mode of transmission: Secondary | ICD-10-CM

## 2019-04-14 DIAGNOSIS — F32A Depression, unspecified: Secondary | ICD-10-CM

## 2019-04-14 DIAGNOSIS — E559 Vitamin D deficiency, unspecified: Secondary | ICD-10-CM | POA: Diagnosis not present

## 2019-04-14 DIAGNOSIS — Z0000 Encounter for general adult medical examination without abnormal findings: Secondary | ICD-10-CM | POA: Diagnosis not present

## 2019-04-14 DIAGNOSIS — F419 Anxiety disorder, unspecified: Secondary | ICD-10-CM | POA: Insufficient documentation

## 2019-04-14 DIAGNOSIS — F329 Major depressive disorder, single episode, unspecified: Secondary | ICD-10-CM

## 2019-04-14 DIAGNOSIS — H5712 Ocular pain, left eye: Secondary | ICD-10-CM | POA: Diagnosis not present

## 2019-04-14 LAB — POCT URINALYSIS DIPSTICK
Bilirubin, UA: NEGATIVE
Blood, UA: NEGATIVE
Glucose, UA: NEGATIVE
Ketones, UA: NEGATIVE
Leukocytes, UA: NEGATIVE
Nitrite, UA: NEGATIVE
Protein, UA: NEGATIVE
Spec Grav, UA: 1.025 (ref 1.010–1.025)
Urobilinogen, UA: 0.2 E.U./dL
pH, UA: 7 (ref 5.0–8.0)

## 2019-04-14 MED ORDER — ALBUTEROL SULFATE HFA 108 (90 BASE) MCG/ACT IN AERS: 1.0000 | INHALATION_SPRAY | Freq: Four times a day (QID) | RESPIRATORY_TRACT | 2 refills | Status: DC | PRN
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
## 2019-04-15 ENCOUNTER — Ambulatory Visit
Admission: RE | Admit: 2019-04-15 | Discharge: 2019-04-15 | Disposition: A | Discharge status: Home / Self Care | Payer: Medicaid Other | Source: Ambulatory Visit | Attending: Nurse Practitioner | Admitting: Nurse Practitioner

## 2019-04-15 DIAGNOSIS — S0592XA Unspecified injury of left eye and orbit, initial encounter: Secondary | ICD-10-CM | POA: Diagnosis not present

## 2019-04-16 ENCOUNTER — Other Ambulatory Visit: Payer: Self-pay | Admitting: Nurse Practitioner

## 2019-04-16 ENCOUNTER — Other Ambulatory Visit: Payer: Self-pay

## 2019-04-16 DIAGNOSIS — E559 Vitamin D deficiency, unspecified: Secondary | ICD-10-CM

## 2019-04-16 LAB — BMP8+ANION GAP
Anion Gap: 13 mmol/L (ref 10.0–18.0)
BUN/Creatinine Ratio: 16 (ref 9–23)
BUN: 12 mg/dL (ref 6–24)
CO2: 23 mmol/L (ref 20–29)
Calcium: 9.3 mg/dL (ref 8.7–10.2)
Chloride: 102 mmol/L (ref 96–106)
Creatinine, Ser: 0.73 mg/dL (ref 0.57–1.00)
GFR calc Af Amer: 117 mL/min/{1.73_m2} (ref >59)
GFR calc non Af Amer: 102 mL/min/{1.73_m2} (ref >59)
Glucose: 86 mg/dL (ref 65–99)
Potassium: 4.5 mmol/L (ref 3.5–5.2)
Sodium: 138 mmol/L (ref 134–144)

## 2019-04-16 LAB — VITAMIN D 25 HYDROXY (VIT D DEFICIENCY, FRACTURES): Vit D, 25-Hydroxy: 22.7 ng/mL — ABNORMAL LOW (ref 30.0–100.0)

## 2019-04-16 LAB — LIPID PANEL
Chol/HDL Ratio: 3 ratio (ref 0.0–4.4)
Cholesterol, Total: 180 mg/dL (ref 100–199)
HDL: 61 mg/dL (ref >39)
LDL Calculated: 104 mg/dL — ABNORMAL HIGH (ref 0–99)
Triglycerides: 74 mg/dL (ref 0–149)
VLDL Cholesterol Cal: 15 mg/dL (ref 5–40)

## 2019-04-16 LAB — HSV 2 ANTIBODY, IGG: HSV 2 IgG, Type Spec: <0.91 index (ref 0.00–0.90)

## 2019-04-16 LAB — HIV ANTIBODY (ROUTINE TESTING W REFLEX): HIV Screen 4th Generation wRfx: NONREACTIVE

## 2019-04-16 LAB — HSV 1 ANTIBODY, IGG: HSV 1 Glycoprotein G Ab, IgG: 20.5 index — ABNORMAL HIGH (ref 0.00–0.90)

## 2019-04-16 LAB — T PALLIDUM SCREENING CASCADE: T pallidum Antibodies (TP-PA): NONREACTIVE

## 2019-04-16 LAB — HEMOGLOBIN A1C
Est. average glucose Bld gHb Est-mCnc: <74 mg/dL
Hgb A1c MFr Bld: <4.2 % — ABNORMAL LOW (ref 4.8–5.6)

## 2019-04-16 MED ORDER — VALACYCLOVIR HCL 500 MG PO TABS: 500.0000 mg | ORAL_TABLET | Freq: Two times a day (BID) | ORAL | 2 refills | Status: DC

## 2019-04-16 MED ORDER — VITAMIN D (ERGOCALCIFEROL) 1.25 MG (50000 UNIT) PO CAPS: 50000.0000 [IU] | ORAL_CAPSULE | ORAL | 0 refills | Status: DC

## 2019-04-20 LAB — CERVICOVAGINAL ANCILLARY ONLY
Bacterial vaginitis: POSITIVE — AB
Candida vaginitis: NEGATIVE
Chlamydia: NEGATIVE
Neisseria Gonorrhea: NEGATIVE
Trichomonas: NEGATIVE

## 2019-04-22 LAB — CYTOLOGY - PAP
Diagnosis: UNDETERMINED — AB
HPV: NOT DETECTED

## 2019-05-07 DIAGNOSIS — F4323 Adjustment disorder with mixed anxiety and depressed mood: Secondary | ICD-10-CM | POA: Diagnosis not present

## 2019-05-14 DIAGNOSIS — F4323 Adjustment disorder with mixed anxiety and depressed mood: Secondary | ICD-10-CM | POA: Diagnosis not present

## 2019-05-20 DIAGNOSIS — F4323 Adjustment disorder with mixed anxiety and depressed mood: Secondary | ICD-10-CM | POA: Diagnosis not present

## 2019-05-26 DIAGNOSIS — F4323 Adjustment disorder with mixed anxiety and depressed mood: Secondary | ICD-10-CM | POA: Diagnosis not present

## 2019-06-02 DIAGNOSIS — F4323 Adjustment disorder with mixed anxiety and depressed mood: Secondary | ICD-10-CM | POA: Diagnosis not present

## 2019-06-22 ENCOUNTER — Other Ambulatory Visit: Payer: Self-pay | Admitting: Nurse Practitioner

## 2019-06-30 DIAGNOSIS — F4323 Adjustment disorder with mixed anxiety and depressed mood: Secondary | ICD-10-CM | POA: Diagnosis not present

## 2019-07-04 ENCOUNTER — Other Ambulatory Visit: Payer: Self-pay | Admitting: Nurse Practitioner

## 2019-07-04 DIAGNOSIS — E559 Vitamin D deficiency, unspecified: Secondary | ICD-10-CM

## 2019-07-09 DIAGNOSIS — F4323 Adjustment disorder with mixed anxiety and depressed mood: Secondary | ICD-10-CM | POA: Diagnosis not present

## 2019-07-21 DIAGNOSIS — F4323 Adjustment disorder with mixed anxiety and depressed mood: Secondary | ICD-10-CM | POA: Diagnosis not present

## 2019-08-04 DIAGNOSIS — F4323 Adjustment disorder with mixed anxiety and depressed mood: Secondary | ICD-10-CM | POA: Diagnosis not present

## 2019-08-19 DIAGNOSIS — F4323 Adjustment disorder with mixed anxiety and depressed mood: Secondary | ICD-10-CM | POA: Diagnosis not present

## 2019-08-27 ENCOUNTER — Ambulatory Visit: Payer: Medicaid Other | Admitting: Nurse Practitioner

## 2019-08-27 ENCOUNTER — Encounter: Payer: Self-pay | Admitting: Nurse Practitioner

## 2019-08-27 ENCOUNTER — Other Ambulatory Visit: Payer: Self-pay

## 2019-08-27 VITALS — BP 130/76 | HR 67 | Temp 98.3°F | Ht 66.8 in | Wt 273.8 lb

## 2019-08-27 DIAGNOSIS — R202 Paresthesia of skin: Secondary | ICD-10-CM | POA: Diagnosis not present

## 2019-08-27 DIAGNOSIS — Z6841 Body Mass Index (BMI) 40.0 and over, adult: Secondary | ICD-10-CM

## 2019-08-27 DIAGNOSIS — Z23 Encounter for immunization: Secondary | ICD-10-CM

## 2019-08-27 DIAGNOSIS — R0683 Snoring: Secondary | ICD-10-CM | POA: Diagnosis not present

## 2019-08-27 NOTE — Addendum Note (Signed)
Addended by: Minette Brine F on: 08/27/2019 05:36 PM   Modules accepted: Orders

## 2019-08-27 NOTE — Progress Notes (Signed)
Subjective:     Patient ID: Tricia Curry , female    DOB: 1976/08/31 , 43 y.o.   MRN: [id number]   Chief Complaint  Patient presents with  . Numbness    in knees   . Knee Pain    HPI  She is having pins and needle pains down both legs for one week.  She is on her feet at all times.  She has intermittent back pain. No tightness of her pants around waist.    Wt Readings from Last 3 Encounters: 08/27/19 : 273 lb 12.8 oz (124.2 kg) 04/14/19 : 269 lb (122 kg) 01/27/19 : 262 lb (118.8 kg)   Knee Pain  The incident occurred more than 1 week ago. Pertinent negatives include no loss of sensation or numbness. She reports no foreign bodies present. Exacerbated by: weather.     Past Medical History:  Diagnosis Date  . Asthma      Family History  Problem Relation Age of Onset  . Stroke Maternal Aunt   . Breast cancer Sister   . Hypertension Mother      Current Outpatient Medications:  .  albuterol (VENTOLIN HFA) 108 (90 Base) MCG/ACT inhaler, INHALE 1 TO 2 PUFFS INTO THE LUNGS EVERY 6 HOURS AS NEEDED FOR WHEEZING OR SHORTNESS OF BREATH, Disp: 18 g, Rfl: 2 .  diphenhydrAMINE (BENADRYL) 25 mg capsule, Take 25 mg by mouth daily as needed for allergies., Disp: , Rfl:  .  olopatadine (PATANOL) 0.1 % ophthalmic solution, Place 1 drop into both eyes daily as needed for allergies., Disp: , Rfl:  .  Vitamin D, Ergocalciferol, (DRISDOL) 1.25 MG (50000 UT) CAPS capsule, TAKE 1 CAPSULE BY MOUTH EVERY 7 DAYS, Disp: 12 capsule, Rfl: 0   Allergies  Allergen Reactions  . Peanut-Containing Drug Products Anaphylaxis     Review of Systems  Constitutional: Negative.   Respiratory: Negative.   Cardiovascular: Negative.  Negative for chest pain, palpitations and leg swelling.  Musculoskeletal: Negative.   Skin: Negative.   Neurological: Negative for dizziness, numbness and headaches.     Today's Vitals   08/27/19 1115  BP: 130/76  Pulse: 67  Temp: 98.3 F (36.8 C)  TempSrc: Oral   Weight: 273 lb 12.8 oz (124.2 kg)  Height: 5' 6.8" (1.697 m)   Body mass index is 43.14 kg/m.   Objective:  Physical Exam Constitutional:      Appearance: Normal appearance.  Cardiovascular:     Rate and Rhythm: Normal rate and regular rhythm.     Pulses: Normal pulses.     Heart sounds: Normal heart sounds. No murmur.  Pulmonary:     Effort: Pulmonary effort is normal. No respiratory distress.     Breath sounds: Normal breath sounds.  Musculoskeletal:        General: No tenderness.     Comments: Decreased sensation to right thigh.   Skin:    General: Skin is warm and dry.     Capillary Refill: Capillary refill takes less than 2 seconds.  Neurological:     General: No focal deficit present.     Mental Status: She is alert and oriented to person, place, and time.  Psychiatric:        Mood and Affect: Mood normal.        Behavior: Behavior normal.        Thought Content: Thought content normal.        Judgment: Judgment normal.         Assessment  And Plan:     1. Tingling of skin  She has slightly decreased sensation on right thigh  Encouraged to increase her intake of beets and take Magnesium - Vitamin B12 - TSH - CBC no Diff - Insulin, random(561)  2. Class 3 severe obesity due to excess calories without serious comorbidity with body mass index (BMI) of 40.0 to 44.9 in adult [facility])  Discussed walking in place during commercial breaks while watching TV  3. Need for influenza vaccination  Influenza vaccine given in office  Advised to take Tylenol as needed for muscle aches or fever - Flu Vaccine QUAD 6+ mos PF IM (Fluarix Quad PF)  4. Snoring  Will refer for sleep study due to history of snoring, she will also have fatigue after sleeping a good night  Discussed poor sleep can affect her ability to lose weight - Ambulatory referral to Sleep Studies   Minette Brine, FNP    THE PATIENT IS ENCOURAGED TO PRACTICE SOCIAL DISTANCING DUE TO THE COVID-19  PANDEMIC.

## 2019-08-27 NOTE — Patient Instructions (Signed)
Influenza Virus Vaccine (Flucelvax) What is this medicine? INFLUENZA VIRUS VACCINE (in floo EN zuh VAHY ruhs vak SEEN) helps to reduce the risk of getting influenza also known as the flu. The vaccine only helps protect you against some strains of the flu. This medicine may be used for other purposes; ask your health care provider or pharmacist if you have questions. COMMON BRAND NAME(S): FLUCELVAX What should I tell my health care provider before I take this medicine? They need to know if you have any of these conditions:  bleeding disorder like hemophilia  fever or infection  Guillain-Barre syndrome or other neurological problems  immune system problems  infection with the human immunodeficiency virus (HIV) or AIDS  low blood platelet counts  multiple sclerosis  an unusual or allergic reaction to influenza virus vaccine, other medicines, foods, dyes or preservatives  pregnant or trying to get pregnant  breast-feeding How should I use this medicine? This vaccine is for injection into a muscle. It is given by a health care professional. A copy of Vaccine Information Statements will be given before each vaccination. Read this sheet carefully each time. The sheet may change frequently. Talk to your pediatrician regarding the use of this medicine in children. Special care may be needed. Overdosage: If you think you've taken too much of this medicine contact a poison control center or emergency room at once. Overdosage: If you think you have taken too much of this medicine contact a poison control center or emergency room at once. NOTE: This medicine is only for you. Do not share this medicine with others. What if I miss a dose? This does not apply. What may interact with this medicine?  chemotherapy or radiation therapy  medicines that lower your immune system like etanercept, anakinra, infliximab, and adalimumab  medicines that treat or prevent blood clots like  warfarin  phenytoin  steroid medicines like prednisone or cortisone  theophylline  vaccines This list may not describe all possible interactions. Give your health care provider a list of all the medicines, herbs, non-prescription drugs, or dietary supplements you use. Also tell them if you smoke, drink alcohol, or use illegal drugs. Some items may interact with your medicine. What should I watch for while using this medicine? Report any side effects that do not go away within 3 days to your doctor or health care professional. Call your health care provider if any unusual symptoms occur within 6 weeks of receiving this vaccine. You may still catch the flu, but the illness is not usually as bad. You cannot get the flu from the vaccine. The vaccine will not protect against colds or other illnesses that may cause fever. The vaccine is needed every year. What side effects may I notice from receiving this medicine? Side effects that you should report to your doctor or health care professional as soon as possible:  allergic reactions like skin rash, itching or hives, swelling of the face, lips, or tongue Side effects that usually do not require medical attention (Report these to your doctor or health care professional if they continue or are bothersome.):  fever  headache  muscle aches and pains  pain, tenderness, redness, or swelling at the injection site  tiredness This list may not describe all possible side effects. Call your doctor for medical advice about side effects. You may report side effects to FDA at 1-800-FDA-1088. Where should I keep my medicine? The vaccine will be given by a health care professional in a clinic, pharmacy, doctor's   office, or other health care setting. You will not be given vaccine doses to store at home. NOTE: This sheet is a summary. It may not cover all possible information. If you have questions about this medicine, talk to your doctor, pharmacist, or  health care provider.  2020 Elsevier/Gold Standard (2011-09-12 14:06:47)  

## 2019-08-28 LAB — CBC
Hematocrit: 37.7 % (ref 34.0–46.6)
Hemoglobin: 12.1 g/dL (ref 11.1–15.9)
MCH: 27.4 pg (ref 26.6–33.0)
MCHC: 32.1 g/dL (ref 31.5–35.7)
MCV: 85 fL (ref 79–97)
Platelets: 268 10*3/uL (ref 150–450)
RBC: 4.42 x10E6/uL (ref 3.77–5.28)
RDW: 12.6 % (ref 11.7–15.4)
WBC: 6.4 10*3/uL (ref 3.4–10.8)

## 2019-08-28 LAB — TSH: TSH: 1.45 u[IU]/mL (ref 0.450–4.500)

## 2019-08-28 LAB — VITAMIN B12: Vitamin B-12: 900 pg/mL (ref 232–1245)

## 2019-08-28 LAB — INSULIN, RANDOM: INSULIN: 13.2 u[IU]/mL (ref 2.6–24.9)

## 2019-09-01 DIAGNOSIS — F4323 Adjustment disorder with mixed anxiety and depressed mood: Secondary | ICD-10-CM | POA: Diagnosis not present

## 2019-09-07 ENCOUNTER — Other Ambulatory Visit (HOSPITAL_BASED_OUTPATIENT_CLINIC_OR_DEPARTMENT_OTHER): Payer: Self-pay

## 2019-09-07 DIAGNOSIS — R0683 Snoring: Secondary | ICD-10-CM

## 2019-09-08 ENCOUNTER — Encounter: Payer: Self-pay | Admitting: Nurse Practitioner

## 2019-09-14 ENCOUNTER — Encounter: Payer: Self-pay | Admitting: Nurse Practitioner

## 2019-09-14 ENCOUNTER — Other Ambulatory Visit: Payer: Self-pay

## 2019-09-14 ENCOUNTER — Other Ambulatory Visit: Payer: Self-pay | Admitting: Nurse Practitioner

## 2019-09-14 MED ORDER — ALBUTEROL SULFATE HFA 108 (90 BASE) MCG/ACT IN AERS: INHALATION_SPRAY | RESPIRATORY_TRACT | 2 refills | Status: AC

## 2019-09-14 MED ORDER — VALACYCLOVIR HCL 500 MG PO TABS: 500.0000 mg | ORAL_TABLET | Freq: Two times a day (BID) | ORAL | 3 refills | Status: DC

## 2019-09-17 ENCOUNTER — Ambulatory Visit: Payer: Medicaid Other | Admitting: Nurse Practitioner

## 2019-09-18 ENCOUNTER — Other Ambulatory Visit (HOSPITAL_COMMUNITY): Payer: Medicaid Other

## 2019-09-21 ENCOUNTER — Encounter (HOSPITAL_BASED_OUTPATIENT_CLINIC_OR_DEPARTMENT_OTHER): Payer: Medicaid Other | Admitting: Internal Medicine

## 2019-09-26 ENCOUNTER — Other Ambulatory Visit: Payer: Self-pay | Admitting: Nurse Practitioner

## 2019-09-26 DIAGNOSIS — E559 Vitamin D deficiency, unspecified: Secondary | ICD-10-CM

## 2019-10-01 DIAGNOSIS — F4323 Adjustment disorder with mixed anxiety and depressed mood: Secondary | ICD-10-CM | POA: Diagnosis not present

## 2019-10-14 ENCOUNTER — Other Ambulatory Visit (HOSPITAL_COMMUNITY): Admission: RE | Admit: 2019-10-14 | Payer: Medicaid Other | Source: Ambulatory Visit

## 2019-10-17 ENCOUNTER — Encounter (HOSPITAL_BASED_OUTPATIENT_CLINIC_OR_DEPARTMENT_OTHER): Payer: Medicaid Other | Admitting: Internal Medicine

## 2019-11-04 ENCOUNTER — Other Ambulatory Visit (HOSPITAL_COMMUNITY): Payer: Medicaid Other

## 2019-11-07 ENCOUNTER — Encounter (HOSPITAL_BASED_OUTPATIENT_CLINIC_OR_DEPARTMENT_OTHER): Payer: Medicaid Other | Admitting: Internal Medicine

## 2019-11-09 DIAGNOSIS — F4323 Adjustment disorder with mixed anxiety and depressed mood: Secondary | ICD-10-CM | POA: Diagnosis not present

## 2019-11-23 DIAGNOSIS — F4323 Adjustment disorder with mixed anxiety and depressed mood: Secondary | ICD-10-CM | POA: Diagnosis not present

## 2019-12-03 ENCOUNTER — Ambulatory Visit: Payer: Medicaid Other | Admitting: Internal Medicine

## 2019-12-09 ENCOUNTER — Encounter: Payer: Self-pay | Admitting: Internal Medicine

## 2019-12-09 ENCOUNTER — Ambulatory Visit: Payer: Medicaid Other | Admitting: Internal Medicine

## 2019-12-09 ENCOUNTER — Other Ambulatory Visit: Payer: Self-pay

## 2019-12-09 VITALS — BP 130/84 | HR 72 | Temp 98.8°F | Ht 66.8 in | Wt 270.0 lb

## 2019-12-09 DIAGNOSIS — R1011 Right upper quadrant pain: Secondary | ICD-10-CM

## 2019-12-09 DIAGNOSIS — R1013 Epigastric pain: Secondary | ICD-10-CM

## 2019-12-09 DIAGNOSIS — R195 Other fecal abnormalities: Secondary | ICD-10-CM | POA: Diagnosis not present

## 2019-12-09 DIAGNOSIS — R102 Pelvic and perineal pain: Secondary | ICD-10-CM

## 2019-12-09 DIAGNOSIS — R300 Dysuria: Secondary | ICD-10-CM | POA: Diagnosis not present

## 2019-12-09 DIAGNOSIS — K921 Melena: Secondary | ICD-10-CM

## 2019-12-09 LAB — CBC
Hematocrit: 36.5 % (ref 34.0–46.6)
Hemoglobin: 11.8 g/dL (ref 11.1–15.9)
MCH: 27.6 pg (ref 26.6–33.0)
MCHC: 32.3 g/dL (ref 31.5–35.7)
MCV: 85 fL (ref 79–97)
Platelets: 366 10*3/uL (ref 150–450)
RBC: 4.28 x10E6/uL (ref 3.77–5.28)
RDW: 13.6 % (ref 11.7–15.4)
WBC: 5.9 10*3/uL (ref 3.4–10.8)

## 2019-12-09 LAB — POCT URINALYSIS DIPSTICK
Bilirubin, UA: NEGATIVE
Blood, UA: NEGATIVE
Glucose, UA: NEGATIVE
Ketones, UA: NEGATIVE
Leukocytes, UA: NEGATIVE
Nitrite, UA: NEGATIVE
Protein, UA: POSITIVE — AB
Spec Grav, UA: >=1.03 — AB (ref 1.010–1.025)
Urobilinogen, UA: 0.2 E.U./dL
pH, UA: 6 (ref 5.0–8.0)

## 2019-12-09 LAB — POC HEMOCCULT BLD/STL (OFFICE/1-CARD/DIAGNOSTIC): Fecal Occult Blood, POC: NEGATIVE

## 2019-12-09 NOTE — Progress Notes (Signed)
This visit occurred during the SARS-CoV-2 public health emergency.  Safety protocols were in place, including screening questions prior to the visit, additional usage of staff PPE, and extensive cleaning of exam room while observing appropriate contact time as indicated for disinfecting solutions.  Subjective:     Patient ID: Tricia Curry , female    DOB: 10/12/1976 , 44 y.o.   MRN: [id number]   Chief Complaint  Patient presents with  . Abdominal Pain    Painful, burning in stomach   . burning urination    pressure when urination and moving bowls     HPI  Onset of abdominal burning on RUQ and LUQ and umbilicus and worse with movements, and noticed worse with acidic and spicy foods. Had black stools on Wed. Has been on broth and soup.  Chicken sandwich provoked. Better since, only mild burning. Had black stools x 2 days and denies using Pepto.  Has Hx of GERD and takes omeprazol every am.  She also had malox and gas pills which helped the distention and bloating. Has loose stools last week which x 3 days. Worse with greasy foods and loose stools gets worse  Past Medical History:  Diagnosis Date  . Asthma      Family History  Problem Relation Age of Onset  . Stroke Maternal Aunt   . Breast cancer Sister   . Hypertension Mother      Current Outpatient Medications:  .  albuterol (VENTOLIN HFA) 108 (90 Base) MCG/ACT inhaler, INHALE 1 TO 2 PUFFS INTO THE LUNGS EVERY 6 HOURS AS NEEDED FOR WHEEZING OR SHORTNESS OF BREATH, Disp: 18 g, Rfl: 2 .  diphenhydrAMINE (BENADRYL) 25 mg capsule, Take 25 mg by mouth daily as needed for allergies., Disp: , Rfl:  .  olopatadine (PATANOL) 0.1 % ophthalmic solution, Place 1 drop into both eyes daily as needed for allergies., Disp: , Rfl:  .  Vitamin D, Ergocalciferol, (DRISDOL) 1.25 MG (50000 UT) CAPS capsule, TAKE 1 CAPSULE BY MOUTH EVERY 7 DAYS, Disp: 12 capsule, Rfl: 0   Allergies  Allergen Reactions  . Peanut-Containing Drug Products  Anaphylaxis     Review of Systems  Denies constipation, GED has improved, denies blood in stool, but has had black stools x 2 days which has resolved, has had abdominal distantion, and loose stools provoked with greasy foods  Today's Vitals   12/09/19 1023  BP: 130/84  Pulse: 72  Temp: 98.8 F (37.1 C)  TempSrc: Oral  Weight: 270 lb (122.5 kg)  Height: 5' 6.8" (1.697 m)   Body mass index is 42.54 kg/m.   Objective:  Physical Exam Vitals and nursing note reviewed.  Constitutional:      General: She is not in acute distress.    Appearance: She is obese.  Eyes:     General: No scleral icterus.    Extraocular Movements: Extraocular movements intact.  Cardiovascular:     Heart sounds: Normal heart sounds.  Pulmonary:     Effort: Pulmonary effort is normal.  Abdominal:     General: Abdomen is protuberant. Bowel sounds are normal.     Palpations: Abdomen is soft. There is no fluid wave, hepatomegaly, splenomegaly, mass or pulsatile mass.     Tenderness: There is no guarding or rebound. Negative signs include psoas sign.  Skin:    General: Skin is warm and dry.     Findings: No rash.  Neurological:     Mental Status: She is alert and oriented to person, place,  and time.  Psychiatric:        Mood and Affect: Mood normal.        Behavior: Behavior normal.     ABD- + BS, moderate pain RUQ, mild on L mid lateral Rectal with external hemorrhoids, neg hemocult.     Assessment And Plan:     1. Burning with urination- acute - POCT Urinalysis Dipstick (81002)- trace protein, the rest is neg. SG- 1.030  2. Black tarry stools- resolved.  - POC Hemoccult Bld/Stl (1-Cd Office Dx)- negative - H. pylori antigen, stool  3. Pelvic pressure in female- new. UA negative today. No lower abdominal tenderness today.   4. Epigastric pain- mild possible gastritis. I want to check H pylori  and we will call her when those results are back.  5. RUQ abdominal pain- acute - US Abdomen Limited  RUQ; Future - CMP14 + Anion Gap - CBC no Diff We will inform her when the results are back. She was told if her abdominal pain gets worse, to go to ER.   Tricia Pressly RODRIGUEZ-SOUTHWORTH, PA-C    THE PATIENT IS ENCOURAGED TO PRACTICE SOCIAL DISTANCING DUE TO THE COVID-19 PANDEMIC.

## 2019-12-09 NOTE — Patient Instructions (Signed)
Continue with the bland diet since this has helped you.     Gastritis, Adult  Gastritis is swelling (inflammation) of the stomach. Gastritis can develop quickly (acute). It can also develop slowly over time (chronic). It is important to get help for this condition. If you do not get help, your stomach can bleed, and you can get sores (ulcers) in your stomach. What are the causes? This condition may be caused by:  Germs that get to your stomach.  Drinking too much alcohol.  Medicines you are taking.  Too much acid in the stomach.  A disease of the intestines or stomach.  Stress.  An allergic reaction.  Crohn's disease.  Some cancer treatments (radiation). Sometimes the cause of this condition is not known. What are the signs or symptoms? Symptoms of this condition include:  Pain in your stomach.  A burning feeling in your stomach.  Feeling sick to your stomach (nauseous).  Throwing up (vomiting).  Feeling too full after you eat.  Weight loss.  Bad breath.  Throwing up blood.  Blood in your poop (stool). How is this diagnosed? This condition may be diagnosed with:  Your medical history and symptoms.  A physical exam.  Tests. These can include: ? Blood tests. ? Stool tests. ? A procedure to look inside your stomach (upper endoscopy). ? A test in which a sample of tissue is taken for testing (biopsy). How is this treated? Treatment for this condition depends on what caused it. You may be given:  Antibiotic medicine, if your condition was caused by germs.  H2 blockers and similar medicines, if your condition was caused by too much acid. Follow these instructions at home: Medicines  Take over-the-counter and prescription medicines only as told by your doctor.  If you were prescribed an antibiotic medicine, take it as told by your doctor. Do not stop taking it even if you start to feel better. Eating and drinking   Eat small meals often, instead of  large meals.  Avoid foods and drinks that make your symptoms worse.  Drink enough fluid to keep your pee (urine) pale yellow. Alcohol use  Do not drink alcohol if: ? Your doctor tells you not to drink. ? You are pregnant, may be pregnant, or are planning to become pregnant.  If you drink alcohol: ? Limit your use to:  0-1 drink a day for women.  0-2 drinks a day for men. ? Be aware of how much alcohol is in your drink. In the [place] one drink equals one 12 oz bottle of beer (355 mL), one 5 oz glass of wine (148 mL), or one 1 oz glass of hard liquor (44 mL). General instructions  Talk with your doctor about ways to manage stress. You can exercise or do deep breathing, meditation, or yoga.  Do not smoke or use products that have nicotine or tobacco. If you need help quitting, ask your doctor.  Keep all follow-up visits as told by your doctor. This is important. Contact a doctor if:  Your symptoms get worse.  Your symptoms go away and then come back. Get help right away if:  You throw up blood or something that looks like coffee grounds.  You have black or dark red poop.  You throw up any time you try to drink fluids.  Your stomach pain gets worse.  You have a fever.  You do not feel better after one week. Summary  Gastritis is swelling (inflammation) of the stomach.  You  must get help for this condition. If you do not get help, your stomach can bleed, and you can get sores (ulcers).  This condition is diagnosed with medical history, physical exam, or tests.  You can be treated with medicines for germs or medicines to block too much acid in your stomach. This information is not intended to replace advice given to you by your health care provider. Make sure you discuss any questions you have with your health care provider. Document Revised: 02/18/2018 Document Reviewed: 02/18/2018 Elsevier Patient Education  [place] If you have a  gallbladder condition, you may have trouble digesting fats. Eating a low-fat diet can help reduce your symptoms, and may be helpful before and after having surgery to remove your gallbladder (cholecystectomy). Your health care provider may recommend that you work with a diet and nutrition specialist (dietitian) to help you reduce the amount of fat in your diet. What are tips for following this plan? General guidelines  Limit your fat intake to less than 30% of your total daily calories. If you eat around 1,800 calories each day, this is less than 60 grams (g) of fat per day.  Fat is an important part of a healthy diet. Eating a low-fat diet can make it hard to maintain a healthy body weight. Ask your dietitian how much fat, calories, and other nutrients you need each day.  Eat small, frequent meals throughout the day instead of three large meals.  Drink at least 8-10 cups of fluid a day. Drink enough fluid to keep your urine clear or pale yellow.  Limit alcohol intake to no more than 1 drink a day for nonpregnant women and 2 drinks a day for men. One drink equals 12 oz of beer, 5 oz of wine, or 1 oz of hard liquor. Reading food labels  Check Nutrition Facts on food labels for the amount of fat per serving. Choose foods with less than 3 grams of fat per serving. Shopping  Choose nonfat and low-fat healthy foods. Look for the words "nonfat," "low fat," or "fat free."  Avoid buying processed or prepackaged foods. Cooking  Cook using low-fat methods, such as baking, broiling, grilling, or boiling.  Cook with small amounts of healthy fats, such as olive oil, grapeseed oil, canola oil, or sunflower oil. What foods are recommended?   All fresh, frozen, or canned fruits and vegetables.  Whole grains.  Low-fat or non-fat (skim) milk and yogurt.  Lean meat, skinless poultry, fish, eggs, and beans.  Low-fat protein supplement powders or drinks.  Spices and herbs. What foods are not  recommended?  High-fat foods. These include baked goods, fast food, fatty cuts of meat, ice cream, french toast, sweet rolls, pizza, cheese bread, foods covered with butter, creamy sauces, or cheese.  Fried foods. These include french fries, tempura, battered fish, breaded chicken, fried breads, and sweets.  Foods with strong odors.  Foods that cause bloating and gas. Summary  A low-fat diet can be helpful if you have a gallbladder condition, or before and after gallbladder surgery.  Limit your fat intake to less than 30% of your total daily calories. This is about 60 g of fat if you eat 1,800 calories each day.  Eat small, frequent meals throughout the day instead of three large meals. This information is not intended to replace advice given to you by your health care provider. Make sure you discuss any questions you have with your health care provider. Document Revised:  01/22/2019 Document Reviewed: 11/08/2016 Elsevier Patient Education  2020 [employer].

## 2019-12-10 LAB — CMP14 + ANION GAP
ALT: 16 IU/L (ref 0–32)
AST: 14 IU/L (ref 0–40)
Albumin/Globulin Ratio: 1.1 — ABNORMAL LOW (ref 1.2–2.2)
Albumin: 4.1 g/dL (ref 3.8–4.8)
Alkaline Phosphatase: 72 IU/L (ref 39–117)
Anion Gap: 15 mmol/L (ref 10.0–18.0)
BUN/Creatinine Ratio: 19 (ref 9–23)
BUN: 13 mg/dL (ref 6–24)
Bilirubin Total: <0.2 mg/dL (ref 0.0–1.2)
CO2: 23 mmol/L (ref 20–29)
Calcium: 9.6 mg/dL (ref 8.7–10.2)
Chloride: 102 mmol/L (ref 96–106)
Creatinine, Ser: 0.7 mg/dL (ref 0.57–1.00)
GFR calc Af Amer: 124 mL/min/{1.73_m2} (ref >59)
GFR calc non Af Amer: 107 mL/min/{1.73_m2} (ref >59)
Globulin, Total: 3.9 g/dL (ref 1.5–4.5)
Glucose: 74 mg/dL (ref 65–99)
Potassium: 5 mmol/L (ref 3.5–5.2)
Sodium: 140 mmol/L (ref 134–144)
Total Protein: 8 g/dL (ref 6.0–8.5)

## 2019-12-13 DIAGNOSIS — K921 Melena: Secondary | ICD-10-CM | POA: Diagnosis not present

## 2019-12-15 ENCOUNTER — Ambulatory Visit: Payer: Medicaid Other | Admitting: Psychology

## 2019-12-16 ENCOUNTER — Ambulatory Visit (INDEPENDENT_AMBULATORY_CARE_PROVIDER_SITE_OTHER): Payer: Medicaid Other | Admitting: Psychology

## 2019-12-16 DIAGNOSIS — F331 Major depressive disorder, recurrent, moderate: Secondary | ICD-10-CM

## 2019-12-16 LAB — H. PYLORI ANTIGEN, STOOL: H pylori Ag, Stl: NEGATIVE

## 2019-12-18 ENCOUNTER — Ambulatory Visit
Admission: RE | Admit: 2019-12-18 | Discharge: 2019-12-18 | Disposition: A | Discharge status: Home / Self Care | Payer: Medicaid Other | Source: Ambulatory Visit | Attending: Internal Medicine | Admitting: Internal Medicine

## 2019-12-18 DIAGNOSIS — R1013 Epigastric pain: Secondary | ICD-10-CM

## 2019-12-18 DIAGNOSIS — K760 Fatty (change of) liver, not elsewhere classified: Secondary | ICD-10-CM | POA: Diagnosis not present

## 2019-12-21 ENCOUNTER — Ambulatory Visit (INDEPENDENT_AMBULATORY_CARE_PROVIDER_SITE_OTHER): Payer: Medicaid Other | Admitting: Psychology

## 2019-12-21 ENCOUNTER — Other Ambulatory Visit: Payer: Self-pay | Admitting: Internal Medicine

## 2019-12-21 DIAGNOSIS — R1013 Epigastric pain: Secondary | ICD-10-CM

## 2019-12-21 DIAGNOSIS — F331 Major depressive disorder, recurrent, moderate: Secondary | ICD-10-CM

## 2019-12-21 DIAGNOSIS — F4323 Adjustment disorder with mixed anxiety and depressed mood: Secondary | ICD-10-CM | POA: Diagnosis not present

## 2019-12-21 DIAGNOSIS — R1011 Right upper quadrant pain: Secondary | ICD-10-CM

## 2019-12-24 ENCOUNTER — Other Ambulatory Visit: Payer: Self-pay | Admitting: Nurse Practitioner

## 2019-12-24 DIAGNOSIS — E559 Vitamin D deficiency, unspecified: Secondary | ICD-10-CM

## 2020-01-04 ENCOUNTER — Ambulatory Visit: Payer: Medicaid Other | Admitting: Psychology

## 2020-01-05 ENCOUNTER — Encounter: Payer: Self-pay | Admitting: Internal Medicine

## 2020-01-18 ENCOUNTER — Ambulatory Visit: Payer: Medicaid Other | Admitting: Psychology

## 2020-01-18 DIAGNOSIS — F4323 Adjustment disorder with mixed anxiety and depressed mood: Secondary | ICD-10-CM | POA: Diagnosis not present

## 2020-01-21 ENCOUNTER — Encounter: Payer: Self-pay | Admitting: Internal Medicine

## 2020-01-29 ENCOUNTER — Encounter: Payer: Self-pay | Admitting: Internal Medicine

## 2020-02-01 ENCOUNTER — Ambulatory Visit (INDEPENDENT_AMBULATORY_CARE_PROVIDER_SITE_OTHER): Payer: Medicaid Other | Admitting: Psychology

## 2020-02-01 DIAGNOSIS — F4323 Adjustment disorder with mixed anxiety and depressed mood: Secondary | ICD-10-CM | POA: Diagnosis not present

## 2020-02-01 DIAGNOSIS — F411 Generalized anxiety disorder: Secondary | ICD-10-CM

## 2020-02-15 ENCOUNTER — Ambulatory Visit: Payer: Medicaid Other | Admitting: Psychology

## 2020-02-18 DIAGNOSIS — F4323 Adjustment disorder with mixed anxiety and depressed mood: Secondary | ICD-10-CM | POA: Diagnosis not present

## 2020-02-29 ENCOUNTER — Ambulatory Visit (INDEPENDENT_AMBULATORY_CARE_PROVIDER_SITE_OTHER): Payer: Medicaid Other | Admitting: Psychology

## 2020-02-29 DIAGNOSIS — F4323 Adjustment disorder with mixed anxiety and depressed mood: Secondary | ICD-10-CM | POA: Diagnosis not present

## 2020-02-29 DIAGNOSIS — F331 Major depressive disorder, recurrent, moderate: Secondary | ICD-10-CM | POA: Diagnosis not present

## 2020-03-15 DIAGNOSIS — F4323 Adjustment disorder with mixed anxiety and depressed mood: Secondary | ICD-10-CM | POA: Diagnosis not present

## 2020-03-17 ENCOUNTER — Other Ambulatory Visit: Payer: Self-pay

## 2020-03-17 DIAGNOSIS — E559 Vitamin D deficiency, unspecified: Secondary | ICD-10-CM

## 2020-03-17 MED ORDER — VITAMIN D (ERGOCALCIFEROL) 1.25 MG (50000 UNIT) PO CAPS: ORAL_CAPSULE | ORAL | 0 refills | Status: DC

## 2020-03-27 ENCOUNTER — Other Ambulatory Visit: Payer: Self-pay | Admitting: Nurse Practitioner

## 2020-03-28 ENCOUNTER — Ambulatory Visit: Payer: Medicaid Other | Admitting: Psychology

## 2020-03-29 DIAGNOSIS — F4323 Adjustment disorder with mixed anxiety and depressed mood: Secondary | ICD-10-CM | POA: Diagnosis not present

## 2020-04-04 ENCOUNTER — Ambulatory Visit: Payer: Medicaid Other | Admitting: Psychology

## 2020-04-06 DIAGNOSIS — F4323 Adjustment disorder with mixed anxiety and depressed mood: Secondary | ICD-10-CM | POA: Diagnosis not present

## 2020-04-11 ENCOUNTER — Ambulatory Visit: Payer: Medicaid Other | Admitting: Psychology

## 2020-04-14 ENCOUNTER — Encounter: Payer: Medicaid Other | Admitting: Internal Medicine

## 2020-04-20 DIAGNOSIS — F4323 Adjustment disorder with mixed anxiety and depressed mood: Secondary | ICD-10-CM | POA: Diagnosis not present

## 2020-04-21 ENCOUNTER — Encounter: Payer: Medicaid Other | Admitting: Internal Medicine

## 2020-04-25 ENCOUNTER — Encounter: Payer: Medicaid Other | Admitting: Nurse Practitioner

## 2020-05-04 DIAGNOSIS — F4323 Adjustment disorder with mixed anxiety and depressed mood: Secondary | ICD-10-CM | POA: Diagnosis not present

## 2020-05-09 ENCOUNTER — Ambulatory Visit: Payer: Medicaid Other | Admitting: Psychology

## 2020-05-18 DIAGNOSIS — F4323 Adjustment disorder with mixed anxiety and depressed mood: Secondary | ICD-10-CM | POA: Diagnosis not present

## 2020-06-01 DIAGNOSIS — F4323 Adjustment disorder with mixed anxiety and depressed mood: Secondary | ICD-10-CM | POA: Diagnosis not present

## 2020-06-07 ENCOUNTER — Encounter: Payer: Self-pay | Admitting: Nurse Practitioner

## 2020-06-07 ENCOUNTER — Other Ambulatory Visit: Payer: Self-pay | Admitting: Internal Medicine

## 2020-06-07 DIAGNOSIS — E559 Vitamin D deficiency, unspecified: Secondary | ICD-10-CM

## 2020-06-15 DIAGNOSIS — F4323 Adjustment disorder with mixed anxiety and depressed mood: Secondary | ICD-10-CM | POA: Diagnosis not present

## 2020-06-23 ENCOUNTER — Encounter: Payer: Self-pay | Admitting: Nurse Practitioner

## 2020-06-27 ENCOUNTER — Ambulatory Visit: Payer: Medicaid Other | Admitting: Nurse Practitioner

## 2020-06-27 ENCOUNTER — Encounter: Payer: Self-pay | Admitting: Nurse Practitioner

## 2020-06-27 ENCOUNTER — Other Ambulatory Visit: Payer: Self-pay

## 2020-06-27 ENCOUNTER — Other Ambulatory Visit: Payer: Self-pay | Admitting: Nurse Practitioner

## 2020-06-27 VITALS — BP 118/72 | HR 75 | Temp 98.1°F | Ht 66.8 in | Wt 264.0 lb

## 2020-06-27 DIAGNOSIS — E7800 Pure hypercholesterolemia, unspecified: Secondary | ICD-10-CM | POA: Diagnosis not present

## 2020-06-27 DIAGNOSIS — Z23 Encounter for immunization: Secondary | ICD-10-CM

## 2020-06-27 DIAGNOSIS — Z0000 Encounter for general adult medical examination without abnormal findings: Secondary | ICD-10-CM

## 2020-06-27 DIAGNOSIS — Z6841 Body Mass Index (BMI) 40.0 and over, adult: Secondary | ICD-10-CM

## 2020-06-27 DIAGNOSIS — E559 Vitamin D deficiency, unspecified: Secondary | ICD-10-CM | POA: Diagnosis not present

## 2020-06-27 DIAGNOSIS — Z1159 Encounter for screening for other viral diseases: Secondary | ICD-10-CM | POA: Diagnosis not present

## 2020-06-27 NOTE — Progress Notes (Signed)
This visit occurred during the SARS-CoV-2 public health emergency.  Safety protocols were in place, including screening questions prior to the visit, additional usage of staff PPE, and extensive cleaning of exam room while observing appropriate contact time as indicated for disinfecting solutions.  Subjective:     Patient ID: Tricia Curry , female    DOB: [birth year] , 44 y.o.   MRN: [id number]   Chief Complaint  Patient presents with  . Annual Exam  . Weight Check    HPI  The patient states she uses tubal ligation for birth control.  Patient's last menstrual period was 06/20/2020.  Negative for Dysmenorrhea and Negative for Menorrhagia. Negative for: breast discharge, breast lump(s), breast pain and breast self exam. Associated symptoms include abnormal vaginal bleeding. Pertinent negatives include abnormal bleeding (hematology), anxiety, decreased libido, depression, difficulty falling sleep, dyspareunia, history of infertility, nocturia, sexual dysfunction, sleep disturbances, urinary incontinence, urinary urgency, vaginal discharge and vaginal itching. Diet regular; she is eating more tuna, fruit cups. She is taking breakfast biscuits.  She is trying to stay away from fast food.  She only drinks water, she will drink cranberry juice.  The patient states her exercise level is minimal - having knee problems and is working 2 jobs so her time is limited.    The patient's tobacco use is:  Social History   Tobacco Use  Smoking Status Never Smoker  Smokeless Tobacco Never Used   She has been exposed to passive smoke. The patient's alcohol use is:  Social History   Substance and Sexual Activity  Alcohol Use Yes  . Alcohol/week: 2.0 standard drinks  . Types: 2 Cans of beer per week   Additional information: Last pap 04/14/2019, next one scheduled for 04/13/2022.    She is here for HM  In the past she has tried to change her diet.  She would like to feel healthy and get more energy.   Prior to turning 41 she could drink smoothies and would lose weight. She is not interested in weight loss pills.   Wt Readings from Last 3 Encounters: 06/27/20 : 264 lb (119.7 kg) 12/09/19 : 270 lb (122.5 kg) 08/27/19 : 273 lb 12.8 oz (124.2 kg)  She continues to go to counseling.  Her daughter just had a baby.    Knee Pain  The incident occurred more than 1 week ago. There was no injury mechanism. The pain is present in the left knee and left heel. The quality of the pain is described as aching. Nothing aggravates the symptoms.     Past Medical History:  Diagnosis Date  . Asthma      Family History  Problem Relation Age of Onset  . Stroke Maternal Aunt   . Breast cancer Sister   . Hypertension Mother      Current Outpatient Medications:  .  albuterol (VENTOLIN HFA) 108 (90 Base) MCG/ACT inhaler, INHALE 1 TO 2 PUFFS INTO THE LUNGS EVERY 6 HOURS AS NEEDED FOR WHEEZING OR SHORTNESS OF BREATH, Disp: 18 g, Rfl: 2 .  diphenhydrAMINE (BENADRYL) 25 mg capsule, Take 25 mg by mouth daily as needed for allergies., Disp: , Rfl:  .  olopatadine (PATANOL) 0.1 % ophthalmic solution, Place 1 drop into both eyes daily as needed for allergies., Disp: , Rfl:  .  valACYclovir (VALTREX) 500 MG tablet, TAKE 1 TABLET(500 MG) BY MOUTH TWICE DAILY, Disp: 60 tablet, Rfl: 3 .  Vitamin D, Ergocalciferol, (DRISDOL) 1.25 MG (50000 UNIT) CAPS capsule, TAKE 1 CAPSULE BY  MOUTH EVERY 7 DAYS, Disp: 12 capsule, Rfl: 0   Allergies  Allergen Reactions  . Peanut-Containing Drug Products Anaphylaxis          Review of Systems  Constitutional: Negative.   HENT: Negative.   Eyes: Negative.   Respiratory: Negative.   Cardiovascular: Negative.   Gastrointestinal: Negative.   Endocrine: Negative.   Genitourinary: Negative.   Musculoskeletal: Positive for arthralgias.  Skin: Negative.   Allergic/Immunologic: Negative.   Neurological: Negative.   Hematological: Negative.   Psychiatric/Behavioral: Negative.       Today's Vitals   06/27/20 1140  BP: 118/72  Pulse: 75  Temp: 98.1 F (36.7 C)  TempSrc: Oral  Weight: 264 lb (119.7 kg)  Height: 5' 6.8" (1.697 m)  PainSc: 2   PainLoc: Back   Body mass index is 41.6 kg/m.   Objective:  Physical Exam Vitals reviewed.  Constitutional:      General: She is not in acute distress.    Appearance: Normal appearance. She is well-developed. She is obese.  HENT:     Head: Normocephalic and atraumatic.     Right Ear: Hearing, tympanic membrane, ear canal and external ear normal. There is no impacted cerumen.     Left Ear: Hearing, tympanic membrane, ear canal and external ear normal. There is no impacted cerumen.     Nose:     Comments: Deferred - masked    Mouth/Throat:     Comments: Deferred - masked Eyes:     General: Lids are normal.     Extraocular Movements: Extraocular movements intact.     Conjunctiva/sclera: Conjunctivae normal.     Pupils: Pupils are equal, round, and reactive to light.     Funduscopic exam:    Right eye: No papilledema.        Left eye: No papilledema.  Neck:     Thyroid: No thyroid mass.     Vascular: No carotid bruit.  Cardiovascular:     Rate and Rhythm: Normal rate and regular rhythm.     Pulses: Normal pulses.     Heart sounds: Normal heart sounds. No murmur heard.   Pulmonary:     Effort: Pulmonary effort is normal.     Breath sounds: Normal breath sounds.  Abdominal:     General: Abdomen is flat. Bowel sounds are normal. There is no distension.     Palpations: Abdomen is soft.     Tenderness: There is no abdominal tenderness.  Genitourinary:    Rectum: Guaiac result negative.  Musculoskeletal:        General: No swelling or tenderness. Normal range of motion.     Cervical back: Full passive range of motion without pain, normal range of motion and neck supple.     Right lower leg: No edema.     Left lower leg: No edema.  Skin:    General: Skin is warm and dry.     Capillary Refill: Capillary  refill takes less than 2 seconds.  Neurological:     General: No focal deficit present.     Mental Status: She is alert and oriented to person, place, and time.     Cranial Nerves: No cranial nerve deficit.     Sensory: No sensory deficit.  Psychiatric:        Mood and Affect: Mood normal.        Behavior: Behavior normal.        Thought Content: Thought content normal.  Judgment: Judgment normal.         Assessment And Plan:      1. Encounter for general adult medical examination w/o abnormal findings . Behavior modifications discussed and diet history reviewed.   . Pt will continue to exercise regularly and modify diet with low GI, plant based foods and decrease intake of processed foods.  . Recommend intake of daily multivitamin, Vitamin D, and calcium.  . Recommend mammogram for preventive screenings, as well as recommend immunizations that include influenza, TDAP (up to date) - CMP14+EGFR - CBC  2. Class 3 severe obesity due to excess calories without serious comorbidity with body mass index (BMI) of 40.0 to 44.9 in adult [facility])  Chronic, she has lost approximately 8 lbs since her last office visit  Discussed healthy diet and regular exercise options   Encouraged to exercise at least 150 minutes per week with 2 days of strength training  Phentermine education with side effects of palpitations, headache or inability to sleep    Return in 2 months for weight check.  She will benefit from healthy weight and wellness - Amb Ref to Medical Weight Management - EKG 12-Lead - POCT Urine Pregnancy  3. Encounter for hepatitis C screening test for low risk patient  Will check Hepatitis C screening due to recent recommendations to screen all adults 18 years and older - Hepatitis C antibody  4. Vitamin D deficiency  Will check vitamin D level and supplement as needed.     Also encouraged to spend 15 minutes in the sun daily.  - VITAMIN D 25 Hydroxy (Vit-D Deficiency,  Fractures)  5. Elevated cholesterol  Chronic, controlled  Continue with current medications - Lipid panel  6. Need for influenza vaccination  Influenza vaccine administered  Encouraged to take Tylenol as needed for fever or muscle aches. - Flu Vaccine QUAD 36+ mos IM    Patient was given opportunity to ask questions. Patient verbalized understanding of the plan and was able to repeat key elements of the plan. All questions were answered to their satisfaction.   Teola Bradley, FNP, have reviewed all documentation for this visit. The documentation on 06/27/20 for the exam, diagnosis, procedures, and orders are all accurate and complete.  THE PATIENT IS ENCOURAGED TO PRACTICE SOCIAL DISTANCING DUE TO THE COVID-19 PANDEMIC.

## 2020-06-27 NOTE — Progress Notes (Deleted)
  Tomasa Hose as a scribe for Arnette Felts, FNP.,have documented all relevant documentation on the behalf of Arnette Felts, FNP,as directed by  Arnette Felts, FNP while in the presence of Arnette Felts, FNP.  This visit occurred during the SARS-CoV-2 public health emergency.  Safety protocols were in place, including screening questions prior to the visit, additional usage of staff PPE, and extensive cleaning of exam room while observing appropriate contact time as indicated for disinfecting solutions.  Subjective:     Patient ID: Tricia Curry , female    DOB: 1976-05-24 , 44 y.o.   MRN: [id number]   Chief Complaint  Patient presents with  . Annual Exam  . Weight Check    HPI  Patient is here today for HM exam and weight loss advice  Wt Readings from Last 3 Encounters: 06/27/20 : 264 lb (119.7 kg) 12/09/19 : 270 lb (122.5 kg) 08/27/19 : 273 lb 12.8 oz (124.2 kg)  She is working at [employer] and has had her covid vaccine.       Past Medical History:  Diagnosis Date  . Asthma      Family History  Problem Relation Age of Onset  . Stroke Maternal Aunt   . Breast cancer Sister   . Hypertension Mother      Current Outpatient Medications:  .  albuterol (VENTOLIN HFA) 108 (90 Base) MCG/ACT inhaler, INHALE 1 TO 2 PUFFS INTO THE LUNGS EVERY 6 HOURS AS NEEDED FOR WHEEZING OR SHORTNESS OF BREATH, Disp: 18 g, Rfl: 2 .  diphenhydrAMINE (BENADRYL) 25 mg capsule, Take 25 mg by mouth daily as needed for allergies., Disp: , Rfl:  .  olopatadine (PATANOL) 0.1 % ophthalmic solution, Place 1 drop into both eyes daily as needed for allergies., Disp: , Rfl:  .  valACYclovir (VALTREX) 500 MG tablet, TAKE 1 TABLET(500 MG) BY MOUTH TWICE DAILY, Disp: 60 tablet, Rfl: 3 .  Vitamin D, Ergocalciferol, (DRISDOL) 1.25 MG (50000 UNIT) CAPS capsule, TAKE 1 CAPSULE BY MOUTH EVERY 7 DAYS, Disp: 12 capsule, Rfl: 0   Allergies  Allergen Reactions  . Peanut-Containing Drug Products Anaphylaxis      Review of Systems  Constitutional: Negative.  Negative for fatigue.  HENT: Negative.   Eyes: Negative.   Respiratory: Negative.   Cardiovascular: Negative.   Gastrointestinal: Negative.   Endocrine: Negative for polydipsia, polyphagia and polyuria.  Musculoskeletal: Negative.   Skin: Negative.   Neurological: Negative for dizziness and headaches.  Psychiatric/Behavioral: Negative.      Today's Vitals   06/27/20 1140  BP: 118/72  Pulse: 75  Temp: 98.1 F (36.7 C)  TempSrc: Oral  Weight: 264 lb (119.7 kg)  Height: 5' 6.8" (1.697 m)  PainSc: 2   PainLoc: Back   Body mass index is 41.6 kg/m.    Objective:  Physical Exam      Assessment And Plan:     1. Encounter for general adult medical examination w/o abnormal findings     Patient was given opportunity to ask questions. Patient verbalized understanding of the plan and was able to repeat key elements of the plan. All questions were answered to their satisfaction.  Lowell Guitar, CMA   I, Lowell Guitar, CMA, have reviewed all documentation for this visit. The documentation on 06/27/20 for the exam, diagnosis, procedures, and orders are all accurate and complete.  THE PATIENT IS ENCOURAGED TO PRACTICE SOCIAL DISTANCING DUE TO THE COVID-19 PANDEMIC.

## 2020-06-27 NOTE — Patient Instructions (Signed)
Health Maintenance, Female Adopting a healthy lifestyle and getting preventive care are important in promoting health and wellness. Ask your health care provider about:  The right schedule for you to have regular tests and exams.  Things you can do on your own to prevent diseases and keep yourself healthy. What should I know about diet, weight, and exercise? Eat a healthy diet   Eat a diet that includes plenty of vegetables, fruits, low-fat dairy products, and lean protein.  Do not eat a lot of foods that are high in solid fats, added sugars, or sodium. Maintain a healthy weight Body mass index (BMI) is used to identify weight problems. It estimates body fat based on height and weight. Your health care provider can help determine your BMI and help you achieve or maintain a healthy weight. Get regular exercise Get regular exercise. This is one of the most important things you can do for your health. Most adults should:  Exercise for at least 150 minutes each week. The exercise should increase your heart rate and make you sweat (moderate-intensity exercise).  Do strengthening exercises at least twice a week. This is in addition to the moderate-intensity exercise.  Spend less time sitting. Even light physical activity can be beneficial. Watch cholesterol and blood lipids Have your blood tested for lipids and cholesterol at 43 years of age, then have this test every 5 years. Have your cholesterol levels checked more often if:  Your lipid or cholesterol levels are high.  You are older than 43 years of age.  You are at high risk for heart disease. What should I know about cancer screening? Depending on your health history and family history, you may need to have cancer screening at various ages. This may include screening for:  Breast cancer.  Cervical cancer.  Colorectal cancer.  Skin cancer.  Lung cancer. What should I know about heart disease, diabetes, and high blood  pressure? Blood pressure and heart disease  High blood pressure causes heart disease and increases the risk of stroke. This is more likely to develop in people who have high blood pressure readings, are of African descent, or are overweight.  Have your blood pressure checked: ? Every 3-5 years if you are 18-39 years of age. ? Every year if you are 40 years old or older. Diabetes Have regular diabetes screenings. This checks your fasting blood sugar level. Have the screening done:  Once every three years after age 40 if you are at a normal weight and have a low risk for diabetes.  More often and at a younger age if you are overweight or have a high risk for diabetes. What should I know about preventing infection? Hepatitis B If you have a higher risk for hepatitis B, you should be screened for this virus. Talk with your health care provider to find out if you are at risk for hepatitis B infection. Hepatitis C Testing is recommended for:  Everyone born from 1945 through 1965.  Anyone with known risk factors for hepatitis C. Sexually transmitted infections (STIs)  Get screened for STIs, including gonorrhea and chlamydia, if: ? You are sexually active and are younger than 43 years of age. ? You are older than 43 years of age and your health care provider tells you that you are at risk for this type of infection. ? Your sexual activity has changed since you were last screened, and you are at increased risk for chlamydia or gonorrhea. Ask your health care provider if   you are at risk.  Ask your health care provider about whether you are at high risk for HIV. Your health care provider may recommend a prescription medicine to help prevent HIV infection. If you choose to take medicine to prevent HIV, you should first get tested for HIV. You should then be tested every 3 months for as long as you are taking the medicine. Pregnancy  If you are about to stop having your period (premenopausal) and  you may become pregnant, seek counseling before you get pregnant.  Take 400 to 800 micrograms (mcg) of folic acid every day if you become pregnant.  Ask for birth control (contraception) if you want to prevent pregnancy. Osteoporosis and menopause Osteoporosis is a disease in which the bones lose minerals and strength with aging. This can result in bone fractures. If you are 65 years old or older, or if you are at risk for osteoporosis and fractures, ask your health care provider if you should:  Be screened for bone loss.  Take a calcium or vitamin D supplement to lower your risk of fractures.  Be given hormone replacement therapy (HRT) to treat symptoms of menopause. Follow these instructions at home: Lifestyle  Do not use any products that contain nicotine or tobacco, such as cigarettes, e-cigarettes, and chewing tobacco. If you need help quitting, ask your health care provider.  Do not use street drugs.  Do not share needles.  Ask your health care provider for help if you need support or information about quitting drugs. Alcohol use  Do not drink alcohol if: ? Your health care provider tells you not to drink. ? You are pregnant, may be pregnant, or are planning to become pregnant.  If you drink alcohol: ? Limit how much you use to 0-1 drink a day. ? Limit intake if you are breastfeeding.  Be aware of how much alcohol is in your drink. In the [place] one drink equals one 12 oz bottle of beer (355 mL), one 5 oz glass of wine (148 mL), or one 1 oz glass of hard liquor (44 mL). General instructions  Schedule regular health, dental, and eye exams.  Stay current with your vaccines.  Tell your health care provider if: ? You often feel depressed. ? You have ever been abused or do not feel safe at home. Summary  Adopting a healthy lifestyle and getting preventive care are important in promoting health and wellness.  Follow your health care provider's instructions about healthy  diet, exercising, and getting tested or screened for diseases.  Follow your health care provider's instructions on monitoring your cholesterol and blood pressure. This information is not intended to replace advice given to you by your health care provider. Make sure you discuss any questions you have with your health care provider. Document Revised: 09/24/2018 Document Reviewed: 09/24/2018 Elsevier Patient Education  2020 [employer] Inc[employer]  

## 2020-06-28 ENCOUNTER — Other Ambulatory Visit: Payer: Self-pay | Admitting: Nurse Practitioner

## 2020-06-28 ENCOUNTER — Encounter: Payer: Self-pay | Admitting: Nurse Practitioner

## 2020-06-28 DIAGNOSIS — E559 Vitamin D deficiency, unspecified: Secondary | ICD-10-CM

## 2020-06-28 LAB — CMP14+EGFR
ALT: 14 IU/L (ref 0–32)
AST: 14 IU/L (ref 0–40)
Albumin/Globulin Ratio: 1.1 — ABNORMAL LOW (ref 1.2–2.2)
Albumin: 4.1 g/dL (ref 3.8–4.8)
Alkaline Phosphatase: 69 IU/L (ref 44–121)
BUN/Creatinine Ratio: 12 (ref 9–23)
BUN: 8 mg/dL (ref 6–24)
Bilirubin Total: 0.5 mg/dL (ref 0.0–1.2)
CO2: 24 mmol/L (ref 20–29)
Calcium: 9.1 mg/dL (ref 8.7–10.2)
Chloride: 100 mmol/L (ref 96–106)
Creatinine, Ser: 0.67 mg/dL (ref 0.57–1.00)
GFR calc Af Amer: 125 mL/min/{1.73_m2} (ref >59)
GFR calc non Af Amer: 108 mL/min/{1.73_m2} (ref >59)
Globulin, Total: 3.8 g/dL (ref 1.5–4.5)
Glucose: 82 mg/dL (ref 65–99)
Potassium: 4.1 mmol/L (ref 3.5–5.2)
Sodium: 135 mmol/L (ref 134–144)
Total Protein: 7.9 g/dL (ref 6.0–8.5)

## 2020-06-28 LAB — CBC
Hematocrit: 35.8 % (ref 34.0–46.6)
Hemoglobin: 11.2 g/dL (ref 11.1–15.9)
MCH: 26.5 pg — ABNORMAL LOW (ref 26.6–33.0)
MCHC: 31.3 g/dL — ABNORMAL LOW (ref 31.5–35.7)
MCV: 85 fL (ref 79–97)
Platelets: 315 10*3/uL (ref 150–450)
RBC: 4.23 x10E6/uL (ref 3.77–5.28)
RDW: 14.7 % (ref 11.7–15.4)
WBC: 5.8 10*3/uL (ref 3.4–10.8)

## 2020-06-28 LAB — VITAMIN D 25 HYDROXY (VIT D DEFICIENCY, FRACTURES): Vit D, 25-Hydroxy: 21.2 ng/mL — ABNORMAL LOW (ref 30.0–100.0)

## 2020-06-28 LAB — LIPID PANEL
Chol/HDL Ratio: 3.2 ratio (ref 0.0–4.4)
Cholesterol, Total: 191 mg/dL (ref 100–199)
HDL: 59 mg/dL (ref >39)
LDL Chol Calc (NIH): 119 mg/dL — ABNORMAL HIGH (ref 0–99)
Triglycerides: 72 mg/dL (ref 0–149)
VLDL Cholesterol Cal: 13 mg/dL (ref 5–40)

## 2020-06-28 LAB — POCT URINE PREGNANCY: Preg Test, Ur: NEGATIVE

## 2020-06-28 LAB — HEPATITIS C ANTIBODY: Hep C Virus Ab: <0.1 s/co ratio (ref 0.0–0.9)

## 2020-06-28 MED ORDER — VITAMIN D (ERGOCALCIFEROL) 1.25 MG (50000 UNIT) PO CAPS: ORAL_CAPSULE | ORAL | 1 refills | Status: DC

## 2020-06-29 DIAGNOSIS — F4323 Adjustment disorder with mixed anxiety and depressed mood: Secondary | ICD-10-CM | POA: Diagnosis not present

## 2020-07-04 ENCOUNTER — Encounter: Payer: Self-pay | Admitting: Nurse Practitioner

## 2020-07-04 NOTE — Telephone Encounter (Signed)
Let her know I was under the impression she did not want to take medications at this time and wanted to go to weight management. If she is interested in taking a medication I would recommend Saxenda or Contrave.

## 2020-07-05 ENCOUNTER — Other Ambulatory Visit: Payer: Self-pay | Admitting: Nurse Practitioner

## 2020-07-13 DIAGNOSIS — F4323 Adjustment disorder with mixed anxiety and depressed mood: Secondary | ICD-10-CM | POA: Diagnosis not present

## 2020-07-27 DIAGNOSIS — F4323 Adjustment disorder with mixed anxiety and depressed mood: Secondary | ICD-10-CM | POA: Diagnosis not present

## 2020-08-04 ENCOUNTER — Other Ambulatory Visit: Payer: Self-pay

## 2020-08-04 ENCOUNTER — Ambulatory Visit (INDEPENDENT_AMBULATORY_CARE_PROVIDER_SITE_OTHER): Payer: Medicaid Other | Admitting: Family Medicine

## 2020-08-04 ENCOUNTER — Encounter (INDEPENDENT_AMBULATORY_CARE_PROVIDER_SITE_OTHER): Payer: Self-pay | Admitting: Family Medicine

## 2020-08-04 VITALS — BP 117/79 | HR 72 | Temp 98.6°F | Ht 66.0 in | Wt 260.0 lb

## 2020-08-04 DIAGNOSIS — R0602 Shortness of breath: Secondary | ICD-10-CM | POA: Diagnosis not present

## 2020-08-04 DIAGNOSIS — Z0289 Encounter for other administrative examinations: Secondary | ICD-10-CM

## 2020-08-04 DIAGNOSIS — Z6841 Body Mass Index (BMI) 40.0 and over, adult: Secondary | ICD-10-CM

## 2020-08-04 DIAGNOSIS — E162 Hypoglycemia, unspecified: Secondary | ICD-10-CM

## 2020-08-04 DIAGNOSIS — E7849 Other hyperlipidemia: Secondary | ICD-10-CM | POA: Diagnosis not present

## 2020-08-04 DIAGNOSIS — Z1331 Encounter for screening for depression: Secondary | ICD-10-CM | POA: Diagnosis not present

## 2020-08-04 DIAGNOSIS — R5383 Other fatigue: Secondary | ICD-10-CM

## 2020-08-05 ENCOUNTER — Other Ambulatory Visit: Payer: Self-pay | Admitting: Nurse Practitioner

## 2020-08-05 LAB — T3: T3, Total: 117 ng/dL (ref 71–180)

## 2020-08-05 LAB — TSH: TSH: 1.1 u[IU]/mL (ref 0.450–4.500)

## 2020-08-05 LAB — COMPREHENSIVE METABOLIC PANEL
ALT: 9 IU/L (ref 0–32)
AST: 10 IU/L (ref 0–40)
Albumin/Globulin Ratio: 1.1 — ABNORMAL LOW (ref 1.2–2.2)
Albumin: 3.9 g/dL (ref 3.8–4.8)
Alkaline Phosphatase: 71 IU/L (ref 44–121)
BUN/Creatinine Ratio: 13 (ref 9–23)
BUN: 8 mg/dL (ref 6–24)
Bilirubin Total: 0.6 mg/dL (ref 0.0–1.2)
CO2: 23 mmol/L (ref 20–29)
Calcium: 8.8 mg/dL (ref 8.7–10.2)
Chloride: 103 mmol/L (ref 96–106)
Creatinine, Ser: 0.64 mg/dL (ref 0.57–1.00)
GFR calc Af Amer: 126 mL/min/{1.73_m2} (ref >59)
GFR calc non Af Amer: 110 mL/min/{1.73_m2} (ref >59)
Globulin, Total: 3.7 g/dL (ref 1.5–4.5)
Glucose: 84 mg/dL (ref 65–99)
Potassium: 4.4 mmol/L (ref 3.5–5.2)
Sodium: 138 mmol/L (ref 134–144)
Total Protein: 7.6 g/dL (ref 6.0–8.5)

## 2020-08-05 LAB — T4: T4, Total: 7.1 ug/dL (ref 4.5–12.0)

## 2020-08-05 LAB — HEMOGLOBIN A1C
Est. average glucose Bld gHb Est-mCnc: 82 mg/dL
Hgb A1c MFr Bld: 4.5 % — ABNORMAL LOW (ref 4.8–5.6)

## 2020-08-05 LAB — INSULIN, RANDOM: INSULIN: 15.7 u[IU]/mL (ref 2.6–24.9)

## 2020-08-11 DIAGNOSIS — F4323 Adjustment disorder with mixed anxiety and depressed mood: Secondary | ICD-10-CM | POA: Diagnosis not present

## 2020-08-11 NOTE — Progress Notes (Signed)
Chief Complaint:   OBESITY Tricia Curry (MR# [id number]) is a 44 y.o. female who presents for evaluation and treatment of obesity and related comorbidities. Current BMI is Body mass index is 41.97 kg/m. Tricia Curry has been struggling with her weight for many years and has been unsuccessful in either losing weight, maintaining weight loss, or reaching her healthy weight goal.  Tricia Curry is currently in the action stage of change and ready to dedicate time achieving and maintaining a healthier weight. Tricia Curry is interested in becoming our patient and working on intensive lifestyle modifications including (but not limited to) diet and exercise for weight loss.  Tricia Curry's habits were reviewed today and are as follows: Her family eats meals together, her desired weight loss is 15 lbs, she has been heavy most of her life, she started gaining weight after high school, her heaviest weight ever was 300 pounds, she has significant food cravings issues, she snacks frequently in the evenings, she skips meals frequently, she is frequently drinking liquids with calories, she frequently makes poor food choices, she has problems with excessive hunger, she frequently eats larger portions than normal and she struggles with emotional eating.  Depression Screen Tricia Curry's Food and Mood (modified PHQ-9) score was 6.  Depression screen PHQ 2/9 08/04/2020  Decreased Interest 0  Down, Depressed, Hopeless 1  PHQ - 2 Score 1  Altered sleeping 0  Tired, decreased energy 2  Change in appetite 2  Feeling bad or failure about yourself  1  Trouble concentrating 0  Moving slowly or fidgety/restless 0  Suicidal thoughts 0  PHQ-9 Score 6  Difficult doing work/chores -   Subjective:   1. Other fatigue Tricia Curry admits to daytime somnolence and denies waking up still tired. Patent has a history of symptoms of daytime fatigue and morning headache. Tricia Curry generally gets 6 hours of sleep per night, and  states that she has generally restful sleep. Snoring is present. Apneic episodes are not present. Epworth Sleepiness Score is 22.  2. Shortness of breath on exertion Tricia Curry notes increasing shortness of breath with exercising and seems to be worsening over time with weight gain. She notes getting out of breath sooner with activity than she used to. This has not gotten worse recently. Tricia Curry denies shortness of breath at rest or orthopnea.  3. Hypoglycemia Tricia Curry has a history of low fasting glucose readings and low A1c of 4.2 last year. She notes polyphagia.  4. Other hyperlipidemia Tricia Curry has a history of elevated LDL last month, and she is not on statin. She is working on diet and weight loss.  Assessment/Plan:   1. Other fatigue Tricia Curry does feel that her weight is causing her energy to be lower than it should be. Fatigue may be related to obesity, depression or many other causes. Labs will be ordered, and in the meanwhile, Tricia Curry will focus on self care including making healthy food choices, increasing physical activity and focusing on stress reduction.  - T3 - T4 - TSH  2. Shortness of breath on exertion Tricia Curry does feel that she gets out of breath more easily that she used to when she exercises. Tricia Curry's shortness of breath appears to be obesity related and exercise induced. She has agreed to work on weight loss and gradually increase exercise to treat her exercise induced shortness of breath. Will continue to monitor closely.  3. Hypoglycemia Fasting labs will be obtained today, and results with be discussed with Tricia Curry in 2 weeks at her follow up  visit. In the meanwhile Tricia Curry will start her Category 2 plan and will work on weight loss efforts.  - Comprehensive metabolic panel - Hemoglobin A1c - Insulin, random  4. Other hyperlipidemia Cardiovascular risk and specific lipid/LDL goals reviewed. We discussed several lifestyle modifications today and Tricia Curry  will continue to work on diet, exercise and weight loss efforts. We will recheck labs in 3 months. Orders and follow up as documented in patient record.   5. Depression screening Tricia Curry had a positive depression screening. Depression is commonly associated with obesity and often results in emotional eating behaviors. We will monitor this closely and work on CBT to help improve the non-hunger eating patterns. Referral to Psychology may be required if no improvement is seen as she continues in our clinic.  6. Class 3 severe obesity with serious comorbidity and body mass index (BMI) of 40.0 to 44.9 in adult, unspecified obesity type (HCC) Tricia Curry is currently in the action stage of change and her goal is to continue with weight loss efforts. I recommend Tricia Curry begin the structured treatment plan as follows:  She has agreed to the Category 2 Plan.  Exercise goals: No exercise has been prescribed for now, while we concentrate on nutritional changes.   Behavioral modification strategies: increasing lean protein intake, decreasing eating out and no skipping meals.  She was informed of the importance of frequent follow-up visits to maximize her success with intensive lifestyle modifications for her multiple health conditions. She was informed we would discuss her lab results at her next visit unless there is a critical issue that needs to be addressed sooner. Tricia Curry agreed to keep her next visit at the agreed upon time to discuss these results.  Objective:   Blood pressure 117/79, pulse 72, temperature 98.6 F (37 C), height 5\' 6"  (1.676 m), weight 260 lb (117.9 kg), SpO2 99 %. Body mass index is 41.97 kg/m.  EKG: Normal sinus rhythm, rate 66 BPM.  Indirect Calorimeter completed today shows a VO2 of 328 and a REE of 2285.  Her calculated basal metabolic rate is 2286 thus her basal metabolic rate is better than expected.  General: Cooperative, alert, well developed, in no acute  distress. HEENT: Conjunctivae and lids unremarkable. Cardiovascular: Regular rhythm.  Lungs: Normal work of breathing. Neurologic: No focal deficits.   Lab Results  Component Value Date   CREATININE 0.64 08/04/2020   BUN 8 08/04/2020   NA 138 08/04/2020   K 4.4 08/04/2020   CL 103 08/04/2020   CO2 23 08/04/2020   Lab Results  Component Value Date   ALT 9 08/04/2020   AST 10 08/04/2020   ALKPHOS 71 08/04/2020   BILITOT 0.6 08/04/2020   Lab Results  Component Value Date   HGBA1C 4.5 (L) 08/04/2020   HGBA1C <4.2 (L) 04/14/2019   Lab Results  Component Value Date   INSULIN 15.7 08/04/2020   INSULIN 13.2 08/27/2019   Lab Results  Component Value Date   TSH 1.100 08/04/2020   Lab Results  Component Value Date   CHOL 191 06/27/2020   HDL 59 06/27/2020   LDLCALC 119 (H) 06/27/2020   TRIG 72 06/27/2020   CHOLHDL 3.2 06/27/2020   Lab Results  Component Value Date   WBC 5.8 06/27/2020   HGB 11.2 06/27/2020   HCT 35.8 06/27/2020   MCV 85 06/27/2020   PLT 315 06/27/2020   No results found for: IRON, TIBC, FERRITIN  Attestation Statements:   Reviewed by clinician on day of visit: allergies,  medications, problem list, medical history, surgical history, family history, social history, and previous encounter notes.   I, Burt Knack, am acting as transcriptionist for Quillian Quince, MD.  I have reviewed the above documentation for accuracy and completeness, and I agree with the above. - Quillian Quince, MD

## 2020-08-18 ENCOUNTER — Ambulatory Visit (INDEPENDENT_AMBULATORY_CARE_PROVIDER_SITE_OTHER): Payer: Medicaid Other | Admitting: Family Medicine

## 2020-08-18 ENCOUNTER — Encounter (INDEPENDENT_AMBULATORY_CARE_PROVIDER_SITE_OTHER): Payer: Self-pay | Admitting: Family Medicine

## 2020-08-18 ENCOUNTER — Other Ambulatory Visit: Payer: Self-pay

## 2020-08-18 VITALS — BP 119/80 | HR 64 | Temp 98.4°F | Ht 66.0 in | Wt 258.0 lb

## 2020-08-18 DIAGNOSIS — E8881 Metabolic syndrome: Secondary | ICD-10-CM | POA: Diagnosis not present

## 2020-08-18 DIAGNOSIS — Z6841 Body Mass Index (BMI) 40.0 and over, adult: Secondary | ICD-10-CM

## 2020-08-18 MED ORDER — METFORMIN HCL 500 MG PO TABS: 500.0000 mg | ORAL_TABLET | Freq: Every day | ORAL | 0 refills | Status: DC

## 2020-08-18 NOTE — Progress Notes (Signed)
Chief Complaint:   OBESITY Tricia Curry is here to discuss her progress with her obesity treatment plan along with follow-up of her obesity related diagnoses. Tricia Curry is on the Category 2 Plan and states she is following her eating plan approximately 95% of the time. Tricia Curry states she is doing 0 minutes 0 times per week.  Today's visit was #: 2 Starting weight: 260 lbs Starting date: 08/04/2020 Today's weight: 258 lbs Today's date: 08/18/2020 Total lbs lost to date: 2 Total lbs lost since last in-office visit: 2  Interim History: Tricia Curry has done well with weight loss on her plan, but notes increased polyphagia especially later in the day and some cravings as well. She is interested in getting some additional breakfast options.  Subjective:   1. Insulin resistance Tricia Curry has a new diagnosis of insulin resistance. She has normal glucose, elevated insulin, and low Hgb A1c. She notes frequent episodes of hypoglycemia but this has improved somewhat on her Category 2 eating plan. I discussed labs with the patient today.  Assessment/Plan:   1. Insulin resistance Tricia Curry will continue to work on weight loss, exercise, and decreasing simple carbohydrates to help decrease the risk of diabetes. Tricia Curry agreed to start metformin 500 mg q AM with food, with no refills. She was educated in depth on her new diagnosis including a handout of insulin resistance, pre-diabetes, and metformin information handout. Tricia Curry agreed to follow-up with [name] as directed to closely monitor her progress.  - metFORMIN (GLUCOPHAGE) 500 MG tablet; Take 1 tablet (500 mg total) by mouth daily with breakfast.  Dispense: 30 tablet; Refill: 0  2. Class 3 severe obesity with serious comorbidity and body mass index (BMI) of 40.0 to 44.9 in adult, unspecified obesity type (HCC) Tricia Curry is currently in the action stage of change. As such, her goal is to continue with weight loss efforts. She has agreed to the Category  2 Plan with breakfast options.   Behavioral modification strategies: increasing lean protein intake, no skipping meals and better snacking choices.  Tricia Curry has agreed to follow-up with our clinic in 2 weeks. She was informed of the importance of frequent follow-up visits to maximize her success with intensive lifestyle modifications for her multiple health conditions.   Objective:   Blood pressure 119/80, pulse 64, temperature 98.4 F (36.9 C), height 5\' 6"  (1.676 m), weight 258 lb (117 kg), SpO2 99 %. Body mass index is 41.64 kg/m.  General: Cooperative, alert, well developed, in no acute distress. HEENT: Conjunctivae and lids unremarkable. Cardiovascular: Regular rhythm.  Lungs: Normal work of breathing. Neurologic: No focal deficits.   Lab Results  Component Value Date   CREATININE 0.64 08/04/2020   BUN 8 08/04/2020   NA 138 08/04/2020   K 4.4 08/04/2020   CL 103 08/04/2020   CO2 23 08/04/2020   Lab Results  Component Value Date   ALT 9 08/04/2020   AST 10 08/04/2020   ALKPHOS 71 08/04/2020   BILITOT 0.6 08/04/2020   Lab Results  Component Value Date   HGBA1C 4.5 (L) 08/04/2020   HGBA1C <4.2 (L) 04/14/2019   Lab Results  Component Value Date   INSULIN 15.7 08/04/2020   INSULIN 13.2 08/27/2019   Lab Results  Component Value Date   TSH 1.100 08/04/2020   Lab Results  Component Value Date   CHOL 191 06/27/2020   HDL 59 06/27/2020   LDLCALC 119 (H) 06/27/2020   TRIG 72 06/27/2020   CHOLHDL 3.2 06/27/2020   Lab Results  Component Value Date   WBC 5.8 06/27/2020   HGB 11.2 06/27/2020   HCT 35.8 06/27/2020   MCV 85 06/27/2020   PLT 315 06/27/2020   No results found for: IRON, TIBC, FERRITIN  Attestation Statements:   Reviewed by clinician on day of visit: allergies, medications, problem list, medical history, surgical history, family history, social history, and previous encounter notes.  Time spent on visit including pre-visit chart review and  post-visit care and charting was 42 minutes.    I, Burt Knack, am acting as transcriptionist for Quillian Quince, MD.  I have reviewed the above documentation for accuracy and completeness, and I agree with the above. -  Quillian Quince, MD

## 2020-08-19 ENCOUNTER — Other Ambulatory Visit (INDEPENDENT_AMBULATORY_CARE_PROVIDER_SITE_OTHER): Payer: Self-pay | Admitting: Family Medicine

## 2020-08-19 DIAGNOSIS — E8881 Metabolic syndrome: Secondary | ICD-10-CM

## 2020-08-24 DIAGNOSIS — F4323 Adjustment disorder with mixed anxiety and depressed mood: Secondary | ICD-10-CM | POA: Diagnosis not present

## 2020-08-29 ENCOUNTER — Other Ambulatory Visit: Payer: Self-pay

## 2020-08-29 ENCOUNTER — Encounter: Payer: Self-pay | Admitting: Nurse Practitioner

## 2020-08-29 ENCOUNTER — Ambulatory Visit: Payer: Medicaid Other | Admitting: Nurse Practitioner

## 2020-08-29 VITALS — BP 118/82 | HR 82 | Temp 98.5°F | Ht 65.6 in | Wt 258.2 lb

## 2020-08-29 DIAGNOSIS — Z6841 Body Mass Index (BMI) 40.0 and over, adult: Secondary | ICD-10-CM

## 2020-08-29 DIAGNOSIS — E8881 Metabolic syndrome: Secondary | ICD-10-CM

## 2020-08-29 DIAGNOSIS — E88819 Insulin resistance, unspecified: Secondary | ICD-10-CM | POA: Insufficient documentation

## 2020-08-29 HISTORY — DX: Insulin resistance, unspecified: E88.819

## 2020-08-29 MED ORDER — BLOOD GLUCOSE MONITOR KIT: PACK | 0 refills | Status: AC

## 2020-08-29 NOTE — Patient Instructions (Signed)

## 2020-08-29 NOTE — Progress Notes (Signed)
I,Yamilka Roman [employer] as a Education administrator for [employer], FNP.,have documented all relevant documentation on the behalf of Minette Brine, FNP,as directed by  Minette Brine, FNP while in the presence of Minette Brine, [place]. This visit occurred during the SARS-CoV-2 public health emergency.  Safety protocols were in place, including screening questions prior to the visit, additional usage of staff PPE, and extensive cleaning of exam room while observing appropriate contact time as indicated for disinfecting solutions.  Subjective:     Patient ID: Tricia Curry , female    DOB: 05-Oct-1976 , 44 y.o.   MRN: [id number]   Chief Complaint  Patient presents with  . Weight Check    HPI  Patient here for a weight check.  Wt Readings from Last 3 Encounters: 08/29/20 : 258 lb 3.2 oz (117.1 kg) 08/18/20 : 258 lb (117 kg) 08/04/20 : 260 lb (117.9 kg)  She is now on metformin for about 2 weeks for insulin resistance.  She is eating a complex carbohydrate.  She reports her head will start spinning, blurred vision and palpitations. She is more hungry in the morning vs at night.  She is trying to eat healthier options. She is eating 45 calorie bread (1 slice every morning).  She is eager to lose weight and be healthier.  She reports her sleep is improved.      Past Medical History:  Diagnosis Date  . Anemia   . Anxiety   . Asthma   . Back pain   . Constipation   . Depression   . Fatigue   . Food allergy, peanut   . GERD (gastroesophageal reflux disease)   . Joint pain   . Shortness of breath   . Shortness of breath on exertion   . Swelling of both lower extremities   . Vitamin D deficiency      Family History  Problem Relation Age of Onset  . Stroke Maternal Aunt   . Breast cancer Sister   . Hypertension Mother   . Depression Mother   . Anxiety disorder Mother   . Sleep apnea Mother      Current Outpatient Medications:  .  albuterol (VENTOLIN HFA) 108 (90 Base) MCG/ACT inhaler,  INHALE 1 TO 2 PUFFS INTO THE LUNGS EVERY 6 HOURS AS NEEDED FOR WHEEZING OR SHORTNESS OF BREATH, Disp: 18 g, Rfl: 2 .  diphenhydrAMINE (BENADRYL) 25 mg capsule, Take 25 mg by mouth daily as needed for allergies., Disp: , Rfl:  .  metFORMIN (GLUCOPHAGE) 500 MG tablet, Take 1 tablet (500 mg total) by mouth daily with breakfast., Disp: 30 tablet, Rfl: 0 .  olopatadine (PATANOL) 0.1 % ophthalmic solution, Place 1 drop into both eyes daily as needed for allergies., Disp: , Rfl:  .  valACYclovir (VALTREX) 500 MG tablet, TAKE 1 TABLET(500 MG) BY MOUTH TWICE DAILY, Disp: 60 tablet, Rfl: 3 .  Vitamin D, Ergocalciferol, (DRISDOL) 1.25 MG (50000 UNIT) CAPS capsule, TAKE 1 CAPSULE BY MOUTH EVERY 7 DAYS, Disp: 12 capsule, Rfl: 1 .  blood glucose meter kit and supplies KIT, Dispense based on patient and insurance preference. Use up to four times daily as directed. (FOR ICD-9 250.00, 250.01)., Disp: 1 each, Rfl: 0   Allergies  Allergen Reactions  . Peanut-Containing Drug Products Anaphylaxis     Review of Systems  Constitutional: Negative.   Respiratory: Negative.   Cardiovascular: Negative.   Endocrine: Negative.  Negative for polydipsia, polyphagia and polyuria.  Genitourinary: Negative.   Skin: Negative.   Neurological:  Negative.  Negative for dizziness and headaches.  Hematological: Negative.   Psychiatric/Behavioral: Negative.      Today's Vitals   08/29/20 0844  BP: 118/82  Pulse: 82  Temp: 98.5 F (36.9 C)  TempSrc: Oral  Weight: 258 lb 3.2 oz (117.1 kg)  Height: 5' 5.6" (1.666 m)  PainSc: 0-No pain   Body mass index is 42.18 kg/m.   Objective:  Physical Exam Vitals reviewed.  Constitutional:      General: She is not in acute distress.    Appearance: Normal appearance. She is obese.  Pulmonary:     Effort: Pulmonary effort is normal. No respiratory distress.  Neurological:     General: No focal deficit present.     Mental Status: She is alert and oriented to person, place, and  time.     Cranial Nerves: No cranial nerve deficit.  Psychiatric:        Mood and Affect: Mood normal.        Behavior: Behavior normal.        Thought Content: Thought content normal.        Judgment: Judgment normal.         Assessment And Plan:     1. Class 3 severe obesity due to excess calories without serious comorbidity with body mass index (BMI) of 40.0 to 44.9 in adult [facility])  Chronic, she is going to [facility]   2. Insulin resistance  Continue with metformin given by Dr. Leafy Ro  Will try to to order a glucometer to help her to feel better if she is "crashes"    Patient was given opportunity to ask questions. Patient verbalized understanding of the plan and was able to repeat key elements of the plan. All questions were answered to their satisfaction.    Teola Bradley, FNP, have reviewed all documentation for this visit. The documentation on 08/29/20 for the exam, diagnosis, procedures, and orders are all accurate and complete.   THE PATIENT IS ENCOURAGED TO PRACTICE SOCIAL DISTANCING DUE TO THE COVID-19 PANDEMIC.

## 2020-09-01 ENCOUNTER — Encounter (INDEPENDENT_AMBULATORY_CARE_PROVIDER_SITE_OTHER): Payer: Self-pay | Admitting: Family Medicine

## 2020-09-01 ENCOUNTER — Ambulatory Visit (INDEPENDENT_AMBULATORY_CARE_PROVIDER_SITE_OTHER): Payer: Medicaid Other | Admitting: Family Medicine

## 2020-09-01 VITALS — BP 116/78 | HR 85 | Temp 97.6°F | Ht 66.0 in | Wt 256.0 lb

## 2020-09-01 DIAGNOSIS — Z6841 Body Mass Index (BMI) 40.0 and over, adult: Secondary | ICD-10-CM | POA: Diagnosis not present

## 2020-09-01 DIAGNOSIS — E8881 Metabolic syndrome: Secondary | ICD-10-CM | POA: Diagnosis not present

## 2020-09-01 MED ORDER — METFORMIN HCL 500 MG PO TABS: 500.0000 mg | ORAL_TABLET | Freq: Every day | ORAL | 0 refills | Status: DC

## 2020-09-05 NOTE — Progress Notes (Signed)
Chief Complaint:   OBESITY Tricia Curry is here to discuss her progress with her obesity treatment plan along with follow-up of her obesity related diagnoses. Tricia Curry is on the Category 2 Plan with breakfast options and states she is following her eating plan approximately 90% of the time. Tricia Curry states she is doing 0 minutes 0 times per week.  Today's visit was #: 3 Starting weight: 260 lbs Starting date: 08/04/2020 Today's weight: 256 lbs Today's date: 09/01/2020 Total lbs lost to date: 4 Total lbs lost since last in-office visit: 2  Interim History: Tricia Curry continues to do well with weight loss. She is working on increasing her vegetables in her diet, but she may be starting to decrease her protein intake. She has an eating strategy over Thanksgiving which should help minimize holiday weight gain.  Subjective:   1. Insulin resistance Tricia Curry is doing well on her eating plan and diet. She had 1 episode of 79 glucose, but less hypoglycemia on metformin.  Assessment/Plan:   1. Insulin resistance Tricia Curry will continue to work on weight loss, diet, exercise, and decreasing simple carbohydrates to help decrease the risk of diabetes. We will refill metformin for 1 month. Tricia Curry agreed to follow-up with [name] as directed to closely monitor her progress.  - metFORMIN (GLUCOPHAGE) 500 MG tablet; Take 1 tablet (500 mg total) by mouth daily with breakfast.  Dispense: 30 tablet; Refill: 0  2. Class 3 severe obesity with serious comorbidity and body mass index (BMI) of 40.0 to 44.9 in adult, unspecified obesity type (HCC) Tricia Curry is currently in the action stage of change. As such, her goal is to continue with weight loss efforts. She has agreed to the Category 2 Plan with breakfast options.   Behavioral modification strategies: increasing lean protein intake and holiday eating strategies .  Tricia Curry has agreed to follow-up with our clinic in 2 to 3 weeks. She was informed of the  importance of frequent follow-up visits to maximize her success with intensive lifestyle modifications for her multiple health conditions.   Objective:   Blood pressure 116/78, pulse 85, temperature 97.6 F (36.4 C), height 5\' 6"  (1.676 m), weight 256 lb (116.1 kg), SpO2 99 %. Body mass index is 41.32 kg/m.  General: Cooperative, alert, well developed, in no acute distress. HEENT: Conjunctivae and lids unremarkable. Cardiovascular: Regular rhythm.  Lungs: Normal work of breathing. Neurologic: No focal deficits.   Lab Results  Component Value Date   CREATININE 0.64 08/04/2020   BUN 8 08/04/2020   NA 138 08/04/2020   K 4.4 08/04/2020   CL 103 08/04/2020   CO2 23 08/04/2020   Lab Results  Component Value Date   ALT 9 08/04/2020   AST 10 08/04/2020   ALKPHOS 71 08/04/2020   BILITOT 0.6 08/04/2020   Lab Results  Component Value Date   HGBA1C 4.5 (L) 08/04/2020   HGBA1C <4.2 (L) 04/14/2019   Lab Results  Component Value Date   INSULIN 15.7 08/04/2020   INSULIN 13.2 08/27/2019   Lab Results  Component Value Date   TSH 1.100 08/04/2020   Lab Results  Component Value Date   CHOL 191 06/27/2020   HDL 59 06/27/2020   LDLCALC 119 (H) 06/27/2020   TRIG 72 06/27/2020   CHOLHDL 3.2 06/27/2020   Lab Results  Component Value Date   WBC 5.8 06/27/2020   HGB 11.2 06/27/2020   HCT 35.8 06/27/2020   MCV 85 06/27/2020   PLT 315 06/27/2020   No results found  for: IRON, TIBC, FERRITIN  Attestation Statements:   Reviewed by clinician on day of visit: allergies, medications, problem list, medical history, surgical history, family history, social history, and previous encounter notes.   I, Trixie Dredge, am acting as transcriptionist for Dennard Nip, MD.  I have reviewed the above documentation for accuracy and completeness, and I agree with the above. -  Dennard Nip, MD

## 2020-09-07 DIAGNOSIS — F4323 Adjustment disorder with mixed anxiety and depressed mood: Secondary | ICD-10-CM | POA: Diagnosis not present

## 2020-09-15 ENCOUNTER — Ambulatory Visit (INDEPENDENT_AMBULATORY_CARE_PROVIDER_SITE_OTHER): Payer: Medicaid Other | Admitting: Family Medicine

## 2020-09-15 ENCOUNTER — Encounter (INDEPENDENT_AMBULATORY_CARE_PROVIDER_SITE_OTHER): Payer: Self-pay | Admitting: Family Medicine

## 2020-09-15 ENCOUNTER — Other Ambulatory Visit: Payer: Self-pay

## 2020-09-15 VITALS — BP 120/74 | HR 78 | Temp 98.3°F | Ht 66.0 in | Wt 255.0 lb

## 2020-09-15 DIAGNOSIS — Z6841 Body Mass Index (BMI) 40.0 and over, adult: Secondary | ICD-10-CM

## 2020-09-15 DIAGNOSIS — R7303 Prediabetes: Secondary | ICD-10-CM | POA: Diagnosis not present

## 2020-09-18 ENCOUNTER — Other Ambulatory Visit (INDEPENDENT_AMBULATORY_CARE_PROVIDER_SITE_OTHER): Payer: Self-pay | Admitting: Family Medicine

## 2020-09-18 DIAGNOSIS — E8881 Metabolic syndrome: Secondary | ICD-10-CM

## 2020-09-19 NOTE — Telephone Encounter (Signed)
Dr.Beasley patient.

## 2020-09-20 NOTE — Progress Notes (Signed)
Chief Complaint:   OBESITY Tricia Curry is here to discuss her progress with her obesity treatment plan along with follow-up of her obesity related diagnoses. Tricia Curry is on the Category 2 Plan with breakfast options and states she is following her eating plan approximately 95% of the time. Tricia Curry states she is doing 0 minutes 0 times per week.  Today's visit was #: 4 Starting weight: 260 lbs Starting date: 08/04/2020 Today's weight: 255 lbs Today's date: 09/15/2020 Total lbs lost to date: 5 Total lbs lost since last in-office visit: 1  Interim History: Tricia Curry has done very well minimizing weight gain over Thanksgiving, and she has even lost another lb since her last visit. She notes her hunger is improved and she is working on finding healthy options.  Subjective:   1. Pre-diabetes Tricia Curry is tolerating her metformin well, and she denies nausea or vomiting. She is doing well with diet and weight loss.  Assessment/Plan:   1. Pre-diabetes Tricia Curry will continue to work on weight loss, exercise, and decreasing simple carbohydrates to help decrease the risk of diabetes. We will refill metformin for 1 month.  - metFORMIN (GLUCOPHAGE) 500 MG tablet; Take 1 tablet (500 mg total) by mouth daily with breakfast.  Dispense: 30 tablet; Refill: 0  2. Class 3 severe obesity with serious comorbidity and body mass index (BMI) of 40.0 to 44.9 in adult, unspecified obesity type (HCC) Tricia Curry is currently in the action stage of change. As such, her goal is to continue with weight loss efforts. She has agreed to the Category 2 Plan.   Behavioral modification strategies: increasing lean protein intake and holiday eating strategies .  Tricia Curry has agreed to follow-up with our clinic in 2 to 3 weeks. She was informed of the importance of frequent follow-up visits to maximize her success with intensive lifestyle modifications for her multiple health conditions.   Objective:   Blood pressure  120/74, pulse 78, temperature 98.3 F (36.8 C), height 5\' 6"  (1.676 m), weight 255 lb (115.7 kg), SpO2 99 %. Body mass index is 41.16 kg/m.  General: Cooperative, alert, well developed, in no acute distress. HEENT: Conjunctivae and lids unremarkable. Cardiovascular: Regular rhythm.  Lungs: Normal work of breathing. Neurologic: No focal deficits.   Lab Results  Component Value Date   CREATININE 0.64 08/04/2020   BUN 8 08/04/2020   NA 138 08/04/2020   K 4.4 08/04/2020   CL 103 08/04/2020   CO2 23 08/04/2020   Lab Results  Component Value Date   ALT 9 08/04/2020   AST 10 08/04/2020   ALKPHOS 71 08/04/2020   BILITOT 0.6 08/04/2020   Lab Results  Component Value Date   HGBA1C 4.5 (L) 08/04/2020   HGBA1C <4.2 (L) 04/14/2019   Lab Results  Component Value Date   INSULIN 15.7 08/04/2020   INSULIN 13.2 08/27/2019   Lab Results  Component Value Date   TSH 1.100 08/04/2020   Lab Results  Component Value Date   CHOL 191 06/27/2020   HDL 59 06/27/2020   LDLCALC 119 (H) 06/27/2020   TRIG 72 06/27/2020   CHOLHDL 3.2 06/27/2020   Lab Results  Component Value Date   WBC 5.8 06/27/2020   HGB 11.2 06/27/2020   HCT 35.8 06/27/2020   MCV 85 06/27/2020   PLT 315 06/27/2020   No results found for: IRON, TIBC, FERRITIN  Attestation Statements:   Reviewed by clinician on day of visit: allergies, medications, problem list, medical history, surgical history, family history, social  history, and previous encounter notes.   I, Trixie Dredge, am acting as transcriptionist for Dennard Nip, MD.  I have reviewed the above documentation for accuracy and completeness, and I agree with the above. -  Dennard Nip, MD

## 2020-09-21 ENCOUNTER — Other Ambulatory Visit: Payer: Self-pay | Admitting: Nurse Practitioner

## 2020-09-21 MED ORDER — METFORMIN HCL 500 MG PO TABS: 500.0000 mg | ORAL_TABLET | Freq: Every day | ORAL | 0 refills | Status: DC

## 2020-09-26 ENCOUNTER — Other Ambulatory Visit: Payer: Self-pay

## 2020-09-26 ENCOUNTER — Ambulatory Visit (INDEPENDENT_AMBULATORY_CARE_PROVIDER_SITE_OTHER): Payer: Medicaid Other | Admitting: Family Medicine

## 2020-09-26 ENCOUNTER — Encounter (INDEPENDENT_AMBULATORY_CARE_PROVIDER_SITE_OTHER): Payer: Self-pay | Admitting: Family Medicine

## 2020-09-26 VITALS — BP 112/79 | HR 80 | Temp 98.5°F | Ht 66.0 in | Wt 259.0 lb

## 2020-09-26 DIAGNOSIS — Z6841 Body Mass Index (BMI) 40.0 and over, adult: Secondary | ICD-10-CM

## 2020-09-26 DIAGNOSIS — E8881 Metabolic syndrome: Secondary | ICD-10-CM

## 2020-09-27 NOTE — Progress Notes (Signed)
Chief Complaint:   OBESITY Tricia Curry is here to discuss her progress with her obesity treatment plan along with follow-up of her obesity related diagnoses. Tricia Curry is on the Category 2 Plan and states she is following her eating plan approximately 90% of the time. Tricia Curry states she is doing 0 minutes 0 times per week.  Today's visit was #: 5 Starting weight: 260 lbs Starting date: 08/04/2020 Today's weight: 259 lbs Today's date: 09/26/2020 Total lbs lost to date: 1 Total lbs lost since last in-office visit: 0  Interim History: Tricia Curry feels her weight is up due to her being right before her menses. She isn't too worried about Christmas weight gain and she is planning on starting walking.  Subjective:   1. Insulin resistance Tricia Curry is stable on metformin, and she denies nausea, vomiting, or hypoglycemia. She is working on diet and weight loss, but she is struggling more.  Assessment/Plan:   1. Insulin resistance Tricia Curry will continue metformin, and will continue to work on weight loss, diet, exercise, and decreasing simple carbohydrates to help decrease the risk of diabetes. Tricia Curry agreed to follow-up with [name] as directed to closely monitor her progress.  2. Class 3 severe obesity with serious comorbidity and body mass index (BMI) of 40.0 to 44.9 in adult, unspecified obesity type (HCC) Tricia Curry is currently in the action stage of change. As such, her goal is to continue with weight loss efforts. She has agreed to practicing portion control and making smarter food choices, such as increasing vegetables and decreasing simple carbohydrates.   Tricia Curry will plan to change to a lower carbohydrate plan after the holidays.  Behavioral modification strategies: meal planning and cooking strategies and holiday eating strategies .  Tricia Curry has agreed to follow-up with our clinic in 3 to 4 weeks. She was informed of the importance of frequent follow-up visits to maximize her  success with intensive lifestyle modifications for her multiple health conditions.   Objective:   Blood pressure 112/79, pulse 80, temperature 98.5 F (36.9 C), height 5\' 6"  (1.676 m), weight 259 lb (117.5 kg), SpO2 (!) 10 %. Body mass index is 41.8 kg/m.  General: Cooperative, alert, well developed, in no acute distress. HEENT: Conjunctivae and lids unremarkable. Cardiovascular: Regular rhythm.  Lungs: Normal work of breathing. Neurologic: No focal deficits.   Lab Results  Component Value Date   CREATININE 0.64 08/04/2020   BUN 8 08/04/2020   NA 138 08/04/2020   K 4.4 08/04/2020   CL 103 08/04/2020   CO2 23 08/04/2020   Lab Results  Component Value Date   ALT 9 08/04/2020   AST 10 08/04/2020   ALKPHOS 71 08/04/2020   BILITOT 0.6 08/04/2020   Lab Results  Component Value Date   HGBA1C 4.5 (L) 08/04/2020   HGBA1C <4.2 (L) 04/14/2019   Lab Results  Component Value Date   INSULIN 15.7 08/04/2020   INSULIN 13.2 08/27/2019   Lab Results  Component Value Date   TSH 1.100 08/04/2020   Lab Results  Component Value Date   CHOL 191 06/27/2020   HDL 59 06/27/2020   LDLCALC 119 (H) 06/27/2020   TRIG 72 06/27/2020   CHOLHDL 3.2 06/27/2020   Lab Results  Component Value Date   WBC 5.8 06/27/2020   HGB 11.2 06/27/2020   HCT 35.8 06/27/2020   MCV 85 06/27/2020   PLT 315 06/27/2020   No results found for: IRON, TIBC, FERRITIN  Attestation Statements:   Reviewed by clinician on day of  visit: allergies, medications, problem list, medical history, surgical history, family history, social history, and previous encounter notes.  Time spent on visit including pre-visit chart review and post-visit care and charting was 20 minutes.    I, Burt Knack, am acting as transcriptionist for Quillian Quince, MD.  I have reviewed the above documentation for accuracy and completeness, and I agree with the above. -  Quillian Quince, MD

## 2020-10-16 ENCOUNTER — Other Ambulatory Visit (INDEPENDENT_AMBULATORY_CARE_PROVIDER_SITE_OTHER): Payer: Self-pay | Admitting: Family Medicine

## 2020-10-16 DIAGNOSIS — R7303 Prediabetes: Secondary | ICD-10-CM

## 2020-10-17 ENCOUNTER — Encounter (INDEPENDENT_AMBULATORY_CARE_PROVIDER_SITE_OTHER): Payer: Self-pay | Admitting: Family Medicine

## 2020-10-17 ENCOUNTER — Ambulatory Visit (INDEPENDENT_AMBULATORY_CARE_PROVIDER_SITE_OTHER): Payer: Medicaid Other | Admitting: Family Medicine

## 2020-10-17 ENCOUNTER — Other Ambulatory Visit: Payer: Self-pay

## 2020-10-17 VITALS — BP 121/81 | HR 79 | Temp 98.8°F | Ht 66.0 in | Wt 256.0 lb

## 2020-10-17 DIAGNOSIS — E8881 Metabolic syndrome: Secondary | ICD-10-CM

## 2020-10-17 DIAGNOSIS — Z6841 Body Mass Index (BMI) 40.0 and over, adult: Secondary | ICD-10-CM | POA: Diagnosis not present

## 2020-10-17 MED ORDER — METFORMIN HCL 500 MG PO TABS: 500.0000 mg | ORAL_TABLET | Freq: Every day | ORAL | 0 refills | Status: DC

## 2020-10-18 ENCOUNTER — Encounter (INDEPENDENT_AMBULATORY_CARE_PROVIDER_SITE_OTHER): Payer: Self-pay | Admitting: Family Medicine

## 2020-10-19 DIAGNOSIS — F4323 Adjustment disorder with mixed anxiety and depressed mood: Secondary | ICD-10-CM | POA: Diagnosis not present

## 2020-10-19 NOTE — Progress Notes (Signed)
Chief Complaint:   OBESITY Tricia Curry is here to discuss her progress with her obesity treatment plan along with follow-up of her obesity related diagnoses. Naviyah is on practicing portion control and making smarter food choices, such as increasing vegetables and decreasing simple carbohydrates and states she is following her eating plan approximately 90% of the time. [place] states she has been walking for 15 minutes 2 times per week.  Today's visit was #: 6 Starting weight: 260 lbs Starting date: 08/04/2020 Today's weight: 256 lbs Today's date: 10/17/2020 Total lbs lost to date: 4 Total lbs lost since last in-office visit: 3  Interim History: Tricia Curry continues to do well with weight loss even over the holidays. She is working on increasing lean protein and decreasing simple carbohydrates. He hunger is mostly controlled.   Subjective:   1. Insulin resistance Tricia Curry is stable on metformin, and she is doing well minimizing hypoglycemia. She is doing better  Increasing her lean protein.  Assessment/Plan:   1. Insulin resistance Tricia Curry will continue to work on weight loss, diet, exercise, and decreasing simple carbohydrates to help decrease the risk of diabetes. We will refill metformin for 1 month. Tricia Curry agreed to follow-up with [name] as directed to closely monitor her progress.  - metFORMIN (GLUCOPHAGE) 500 MG tablet; Take 1 tablet (500 mg total) by mouth daily with breakfast.  Dispense: 30 tablet; Refill: 0  2. Class 3 severe obesity with serious comorbidity and body mass index (BMI) of 40.0 to 44.9 in adult, unspecified obesity type (HCC) Tricia Curry is currently in the action stage of change. As such, her goal is to continue with weight loss efforts. She has agreed to practicing portion control and making smarter food choices, such as increasing vegetables and decreasing simple carbohydrates.   Exercise goals: As is.  Behavioral modification strategies: increasing lean  protein intake.  Tricia Curry has agreed to follow-up with our clinic in 3 to 4 weeks. She was informed of the importance of frequent follow-up visits to maximize her success with intensive lifestyle modifications for her multiple health conditions.   Objective:   Blood pressure 121/81, pulse 79, temperature 98.8 F (37.1 C), height 5\' 6"  (1.676 m), weight 256 lb (116.1 kg), SpO2 98 %. Body mass index is 41.32 kg/m.  General: Cooperative, alert, well developed, in no acute distress. HEENT: Conjunctivae and lids unremarkable. Cardiovascular: Regular rhythm.  Lungs: Normal work of breathing. Neurologic: No focal deficits.   Lab Results  Component Value Date   CREATININE 0.64 08/04/2020   BUN 8 08/04/2020   NA 138 08/04/2020   K 4.4 08/04/2020   CL 103 08/04/2020   CO2 23 08/04/2020   Lab Results  Component Value Date   ALT 9 08/04/2020   AST 10 08/04/2020   ALKPHOS 71 08/04/2020   BILITOT 0.6 08/04/2020   Lab Results  Component Value Date   HGBA1C 4.5 (L) 08/04/2020   HGBA1C <4.2 (L) 04/14/2019   Lab Results  Component Value Date   INSULIN 15.7 08/04/2020   INSULIN 13.2 08/27/2019   Lab Results  Component Value Date   TSH 1.100 08/04/2020   Lab Results  Component Value Date   CHOL 191 06/27/2020   HDL 59 06/27/2020   LDLCALC 119 (H) 06/27/2020   TRIG 72 06/27/2020   CHOLHDL 3.2 06/27/2020   Lab Results  Component Value Date   WBC 5.8 06/27/2020   HGB 11.2 06/27/2020   HCT 35.8 06/27/2020   MCV 85 06/27/2020   PLT 315  06/27/2020   No results found for: IRON, TIBC, FERRITIN  Attestation Statements:   Reviewed by clinician on day of visit: allergies, medications, problem list, medical history, surgical history, family history, social history, and previous encounter notes.   I, Burt Knack, am acting as transcriptionist for Quillian Quince, MD.  I have reviewed the above documentation for accuracy and completeness, and I agree with the above. -  Quillian Quince, MD

## 2020-10-24 ENCOUNTER — Encounter: Payer: Self-pay | Admitting: Nurse Practitioner

## 2020-10-24 ENCOUNTER — Ambulatory Visit (INDEPENDENT_AMBULATORY_CARE_PROVIDER_SITE_OTHER): Payer: Medicaid Other | Admitting: Nurse Practitioner

## 2020-10-24 ENCOUNTER — Other Ambulatory Visit: Payer: Self-pay

## 2020-10-24 VITALS — BP 132/78 | HR 70 | Temp 98.6°F

## 2020-10-24 DIAGNOSIS — R059 Cough, unspecified: Secondary | ICD-10-CM | POA: Diagnosis not present

## 2020-10-24 DIAGNOSIS — Z20822 Contact with and (suspected) exposure to covid-19: Secondary | ICD-10-CM | POA: Diagnosis not present

## 2020-10-24 NOTE — Progress Notes (Signed)
Rutherford Nail as a scribe for Minette Brine, FNP.,have documented all relevant documentation on the behalf of Minette Brine, FNP,as directed by  Minette Brine, FNP while in the presence of Minette Brine, [place]. This visit occurred during the SARS-CoV-2 public health emergency.  Safety protocols were in place, including screening questions prior to the visit, additional usage of staff PPE, and extensive cleaning of exam room while observing appropriate contact time as indicated for disinfecting solutions.  Subjective:     Patient ID: Tricia Curry , female    DOB: [birth year] , 45 y.o.   MRN: [id number]   Chief Complaint  Patient presents with  . Cough  . Generalized Body Aches    Head congestion     HPI  Pt here today for COVID testing she has a cough, and head congestion that started Friday. 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 She is seen outside.   Cough This is a new problem. The current episode started in the past 7 days. The problem has been unchanged. The cough is non-productive. Pertinent negatives include no chest pain or headaches. Nothing aggravates the symptoms. She has tried nothing for the symptoms.     Past Medical History:  Diagnosis Date  . Anemia   . Anxiety   . Asthma   . Back pain   . Constipation   . Depression   . Fatigue   . Food allergy, peanut   . GERD (gastroesophageal reflux disease)   . Joint pain   . Shortness of breath   . Shortness of breath on exertion   . Swelling of both lower extremities   . Vitamin D deficiency      Family History  Problem Relation Age of Onset  . Stroke Maternal Aunt   . Breast cancer Sister   . Hypertension Mother   . Depression Mother   . Anxiety disorder Mother   . Sleep apnea Mother      Current Outpatient Medications:  .  Accu-Chek Softclix Lancets lancets, USE UP TO FOUR TIMES DAILY AS DIRECTED, Disp: 100 each, Rfl: 2 .  albuterol (VENTOLIN HFA) 108 (90 Base) MCG/ACT inhaler, INHALE 1 TO 2  PUFFS INTO THE LUNGS EVERY 6 HOURS AS NEEDED FOR WHEEZING OR SHORTNESS OF BREATH, Disp: 18 g, Rfl: 2 .  blood glucose meter kit and supplies KIT, Dispense based on patient and insurance preference. Use up to four times daily as directed. (FOR ICD-9 250.00, 250.01)., Disp: 1 each, Rfl: 0 .  diphenhydrAMINE (BENADRYL) 25 mg capsule, Take 25 mg by mouth daily as needed for allergies., Disp: , Rfl:  .  olopatadine (PATANOL) 0.1 % ophthalmic solution, Place 1 drop into both eyes daily as needed for allergies., Disp: , Rfl:  .  valACYclovir (VALTREX) 500 MG tablet, TAKE 1 TABLET(500 MG) BY MOUTH TWICE DAILY, Disp: 60 tablet, Rfl: 3 .  ACCU-CHEK GUIDE test strip, USE UP TO FOUR TIMES DAILY AS NEEDED, Disp: 100 strip, Rfl: 2 .  glucose blood (ACCU-CHEK GUIDE) test strip, USE UP TO FOUR TIMES DAILY AS NEEDED, Disp: 100 strip, Rfl: 2 .  metFORMIN (GLUCOPHAGE) 500 MG tablet, TAKE 1 TABLET(500 MG) BY MOUTH DAILY WITH BREAKFAST, Disp: 30 tablet, Rfl: 0 .  Vitamin D, Ergocalciferol, (DRISDOL) 1.25 MG (50000 UNIT) CAPS capsule, TAKE 1 CAPSULE BY MOUTH EVERY 7 DAYS, Disp: 4 capsule, Rfl: 0   Allergies  Allergen Reactions  . Peanut-Containing Drug Products Anaphylaxis     Review of Systems  Constitutional: Negative.  Negative for fatigue.  HENT: Negative.   Respiratory: Positive for cough.   Cardiovascular: Negative for chest pain.  Endocrine: Negative for polydipsia, polyphagia and polyuria.  Musculoskeletal: Negative.   Skin: Negative.   Neurological: Negative for dizziness and headaches.  Psychiatric/Behavioral: Negative.      Today's Vitals   10/24/20 1445  BP: 132/78  Pulse: 70  Temp: 98.6 F (37 C)  TempSrc: Oral   There is no height or weight on file to calculate BMI.   Objective:  Physical Exam Vitals reviewed.  Constitutional:      General: She is not in acute distress.    Appearance: Normal appearance. She is obese.  Cardiovascular:     Rate and Rhythm: Normal rate and regular  rhythm.     Pulses: Normal pulses.     Heart sounds: Normal heart sounds. No murmur heard.   Pulmonary:     Effort: Pulmonary effort is normal. No respiratory distress.     Breath sounds: Normal breath sounds.  Neurological:     General: No focal deficit present.     Mental Status: She is alert and oriented to person, place, and time.     Cranial Nerves: No cranial nerve deficit.  Psychiatric:        Mood and Affect: Mood normal.        Behavior: Behavior normal.        Thought Content: Thought content normal.        Judgment: Judgment normal.         Assessment And Plan:     1. Cough  She is treat with symptom management with over the counter cough syrup and use her albuterol inhaler as needed - Novel Coronavirus, NAA ([facility])  2. Close exposure to COVID-19 virus  remain in isolation until has results If has shortness of breath or chest pain to go to ER for evaluation  Patient was given opportunity to ask questions. Patient verbalized understanding of the plan and was able to repeat key elements of the plan. All questions were answered to their satisfaction.  Minette Brine, FNP   I, Minette Brine, FNP, have reviewed all documentation for this visit. The documentation on 11/30/20 for the exam, diagnosis, procedures, and orders are all accurate and complete.   THE PATIENT IS ENCOURAGED TO PRACTICE SOCIAL DISTANCING DUE TO THE COVID-19 PANDEMIC.

## 2020-10-24 NOTE — Patient Instructions (Signed)
Take vitamin d, vitamin c and zinc daily Stay well hydrated with water and vitamin drinks Remain in isolation until has negative covid test

## 2020-10-25 LAB — SARS-COV-2, NAA 2 DAY TAT

## 2020-10-25 LAB — NOVEL CORONAVIRUS, NAA: SARS-CoV-2, NAA: DETECTED — AB

## 2020-11-02 ENCOUNTER — Other Ambulatory Visit: Payer: Self-pay | Admitting: Nurse Practitioner

## 2020-11-02 DIAGNOSIS — F4323 Adjustment disorder with mixed anxiety and depressed mood: Secondary | ICD-10-CM | POA: Diagnosis not present

## 2020-11-07 ENCOUNTER — Other Ambulatory Visit: Payer: Self-pay

## 2020-11-07 ENCOUNTER — Encounter (INDEPENDENT_AMBULATORY_CARE_PROVIDER_SITE_OTHER): Payer: Self-pay | Admitting: Family Medicine

## 2020-11-07 ENCOUNTER — Encounter: Payer: Self-pay | Admitting: Nurse Practitioner

## 2020-11-07 ENCOUNTER — Ambulatory Visit (INDEPENDENT_AMBULATORY_CARE_PROVIDER_SITE_OTHER): Payer: Medicaid Other | Admitting: Family Medicine

## 2020-11-07 VITALS — BP 113/74 | HR 77 | Temp 98.3°F | Ht 66.0 in | Wt 250.0 lb

## 2020-11-07 DIAGNOSIS — Z6841 Body Mass Index (BMI) 40.0 and over, adult: Secondary | ICD-10-CM

## 2020-11-07 DIAGNOSIS — R7303 Prediabetes: Secondary | ICD-10-CM

## 2020-11-07 DIAGNOSIS — E559 Vitamin D deficiency, unspecified: Secondary | ICD-10-CM | POA: Diagnosis not present

## 2020-11-07 MED ORDER — ACCU-CHEK GUIDE VI STRP: ORAL_STRIP | 2 refills | Status: AC

## 2020-11-07 MED ORDER — VITAMIN D (ERGOCALCIFEROL) 1.25 MG (50000 UNIT) PO CAPS: ORAL_CAPSULE | ORAL | 0 refills | Status: DC

## 2020-11-08 NOTE — Progress Notes (Signed)
Chief Complaint:   OBESITY Tricia Curry is here to discuss her progress with her obesity treatment plan along with follow-up of her obesity related diagnoses. Tricia Curry is on practicing portion control and making smarter food choices, such as increasing vegetables and decreasing simple carbohydrates and states she is following her eating plan approximately 95% of the time. Tricia Curry states she is walking for 30 minutes 7 times per week.  Today's visit was #: 7 Starting weight: 260 lbs Starting date: 08/04/2020 Today's weight: 250 lbs Today's date: 11/07/2020 Total lbs lost to date: 10 Total lbs lost since last in-office visit: 6  Interim History: Tricia Curry continues to do well with weight loss. She hasn't been able to follow her plan as closely while she was recovering from COVID, but she is feeling better now. She would like to do fruit and veggies only smoothies at work sometimes.  Subjective:   1. Vitamin D deficiency Tricia Curry is on Vit D and denies any side effects. She is due for labs in about 1 month.  2. Pre-diabetes Tricia Curry is stable on metformin, and she denies nausea or vomiting. She is doing well with diet.  Assessment/Plan:   1. Vitamin D deficiency Low Vitamin D level contributes to fatigue and are associated with obesity, breast, and colon cancer. We will refill prescription Vitamin D for 1 month, and we will recheck labs in 1 month. Farzana will follow-up for routine testing of Vitamin D, at least 2-3 times per year to avoid over-replacement.  - Vitamin D, Ergocalciferol, (DRISDOL) 1.25 MG (50000 UNIT) CAPS capsule; TAKE 1 CAPSULE BY MOUTH EVERY 7 DAYS  Dispense: 4 capsule; Refill: 0  2. Pre-diabetes [name] will continue to work on weight loss, exercise, and decreasing simple carbohydrates to help decrease the risk of diabetes. We will recheck labs in 1 month.  3. Class 3 severe obesity with serious comorbidity and body mass index (BMI) of 40.0 to 44.9 in adult,  unspecified obesity type (HCC) Tricia Curry is currently in the action stage of change. As such, her goal is to continue with weight loss efforts. She has agreed to practicing portion control and making smarter food choices, such as increasing vegetables and decreasing simple carbohydrates.   Tricia Curry was advised that she burns more calories with eating her food than drinking it, but as long as she does mostly vegetables she is ok to do this occasionally.  Exercise goals: As is.  Behavioral modification strategies: increasing lean protein intake and increasing vegetables.  Tricia Curry has agreed to follow-up with our clinic in 2 weeks with Alois Cliche, PA-C. She was informed of the importance of frequent follow-up visits to maximize her success with intensive lifestyle modifications for her multiple health conditions.   Objective:   Blood pressure 113/74, pulse 77, temperature 98.3 F (36.8 C), height 5\' 6"  (1.676 m), weight 250 lb (113.4 kg), SpO2 99 %. Body mass index is 40.35 kg/m.  General: Cooperative, alert, well developed, in no acute distress. HEENT: Conjunctivae and lids unremarkable. Cardiovascular: Regular rhythm.  Lungs: Normal work of breathing. Neurologic: No focal deficits.   Lab Results  Component Value Date   CREATININE 0.64 08/04/2020   BUN 8 08/04/2020   NA 138 08/04/2020   K 4.4 08/04/2020   CL 103 08/04/2020   CO2 23 08/04/2020   Lab Results  Component Value Date   ALT 9 08/04/2020   AST 10 08/04/2020   ALKPHOS 71 08/04/2020   BILITOT 0.6 08/04/2020   Lab Results  Component  Value Date   HGBA1C 4.5 (L) 08/04/2020   HGBA1C <4.2 (L) 04/14/2019   Lab Results  Component Value Date   INSULIN 15.7 08/04/2020   INSULIN 13.2 08/27/2019   Lab Results  Component Value Date   TSH 1.100 08/04/2020   Lab Results  Component Value Date   CHOL 191 06/27/2020   HDL 59 06/27/2020   LDLCALC 119 (H) 06/27/2020   TRIG 72 06/27/2020   CHOLHDL 3.2 06/27/2020    Lab Results  Component Value Date   WBC 5.8 06/27/2020   HGB 11.2 06/27/2020   HCT 35.8 06/27/2020   MCV 85 06/27/2020   PLT 315 06/27/2020   No results found for: IRON, TIBC, FERRITIN  Attestation Statements:   Reviewed by clinician on day of visit: allergies, medications, problem list, medical history, surgical history, family history, social history, and previous encounter notes.   I, Burt Knack, am acting as transcriptionist for Quillian Quince, MD.  I have reviewed the above documentation for accuracy and completeness, and I agree with the above. -  Quillian Quince, MD

## 2020-11-09 DIAGNOSIS — F4323 Adjustment disorder with mixed anxiety and depressed mood: Secondary | ICD-10-CM | POA: Diagnosis not present

## 2020-11-12 ENCOUNTER — Encounter: Payer: Self-pay | Admitting: Nurse Practitioner

## 2020-11-15 ENCOUNTER — Encounter (INDEPENDENT_AMBULATORY_CARE_PROVIDER_SITE_OTHER): Payer: Self-pay

## 2020-11-15 ENCOUNTER — Other Ambulatory Visit (INDEPENDENT_AMBULATORY_CARE_PROVIDER_SITE_OTHER): Payer: Self-pay | Admitting: Family Medicine

## 2020-11-15 DIAGNOSIS — E8881 Metabolic syndrome: Secondary | ICD-10-CM

## 2020-11-15 NOTE — Telephone Encounter (Signed)
Dr Beasley pt 

## 2020-11-15 NOTE — Telephone Encounter (Signed)
MyChart message sent to pt to find out if they have enough medication to get them through until next appt.   

## 2020-11-15 NOTE — Telephone Encounter (Signed)
Ok x 1

## 2020-11-15 NOTE — Telephone Encounter (Signed)
Would you like to refill? Pt will be out prior to next appt.

## 2020-11-18 DIAGNOSIS — F4323 Adjustment disorder with mixed anxiety and depressed mood: Secondary | ICD-10-CM | POA: Diagnosis not present

## 2020-11-28 ENCOUNTER — Ambulatory Visit (INDEPENDENT_AMBULATORY_CARE_PROVIDER_SITE_OTHER): Payer: Medicaid Other | Admitting: Family Medicine

## 2020-11-28 ENCOUNTER — Encounter (INDEPENDENT_AMBULATORY_CARE_PROVIDER_SITE_OTHER): Payer: Self-pay | Admitting: Family Medicine

## 2020-11-28 ENCOUNTER — Encounter: Payer: Self-pay | Admitting: Nurse Practitioner

## 2020-11-28 ENCOUNTER — Other Ambulatory Visit: Payer: Self-pay

## 2020-11-28 VITALS — BP 117/76 | HR 81 | Temp 98.5°F | Ht 66.0 in | Wt 256.0 lb

## 2020-11-28 DIAGNOSIS — E8881 Metabolic syndrome: Secondary | ICD-10-CM

## 2020-11-28 DIAGNOSIS — Z6841 Body Mass Index (BMI) 40.0 and over, adult: Secondary | ICD-10-CM

## 2020-11-28 DIAGNOSIS — F439 Reaction to severe stress, unspecified: Secondary | ICD-10-CM

## 2020-11-29 ENCOUNTER — Ambulatory Visit: Payer: Medicaid Other | Admitting: Nurse Practitioner

## 2020-11-29 NOTE — Progress Notes (Signed)
Chief Complaint:   OBESITY Tricia Curry is here to discuss her progress with her obesity treatment plan along with follow-up of her obesity related diagnoses. Tricia Curry is on practicing portion control and making smarter food choices, such as increasing vegetables and decreasing simple carbohydrates and states she is following her eating plan approximately 90% of the time. Tricia Curry states she is doing 0 minutes 0 times per week.  Today's visit was #: 8 Starting weight: 260 lbs Starting date: 08/04/2020 Today's weight: 256 lbs Today's date: 11/28/2020 Total lbs lost to date: 4 Total lbs lost since last in-office visit: 0  Interim History: Tricia Curry has struggled with staying on track. She has had a lot of stress in her life and she notes she did increase snacking. She feels this next week will be better.  Subjective:   1. Insulin resistance Tricia Curry is stable on metformin, normally she doesn't have GI upset.   2. Stress Tricia Curry has increased stress at home and at work, and with the recent death of her friend. She is on Zoloft (no on medication list), and she sees her therapist every 2 weeks. Her next appointment is in 2 days.  Assessment/Plan:   1. Insulin resistance Tricia Curry will continue metformin as is, and will continue to work on weight loss, exercise, and decreasing simple carbohydrates to help decrease the risk of diabetes. Tricia Curry agreed to follow-up with [name] as directed to closely monitor her progress.  2. Stress Tricia Curry will continue to see her therapist and take Zoloft as prescribed, and will continue to monitor. Support and encouragement was offered.   3. Class 3 severe obesity with serious comorbidity and body mass index (BMI) of 40.0 to 44.9 in adult, unspecified obesity type (HCC) Tricia Curry is currently in the action stage of change. As such, her goal is to continue with weight loss efforts. She has agreed to practicing portion control and making smarter food choices,  such as increasing vegetables and decreasing simple carbohydrates.   Behavioral modification strategies: increasing lean protein intake and emotional eating strategies.  Tricia Curry has agreed to follow-up with our clinic in 2 weeks. She was informed of the importance of frequent follow-up visits to maximize her success with intensive lifestyle modifications for her multiple health conditions.   Objective:   Blood pressure 117/76, pulse 81, temperature 98.5 F (36.9 C), height 5\' 6"  (1.676 m), weight 256 lb (116.1 kg), SpO2 99 %. Body mass index is 41.32 kg/m.  General: Cooperative, alert, well developed, in no acute distress. HEENT: Conjunctivae and lids unremarkable. Cardiovascular: Regular rhythm.  Lungs: Normal work of breathing. Neurologic: No focal deficits.   Lab Results  Component Value Date   CREATININE 0.64 08/04/2020   BUN 8 08/04/2020   NA 138 08/04/2020   K 4.4 08/04/2020   CL 103 08/04/2020   CO2 23 08/04/2020   Lab Results  Component Value Date   ALT 9 08/04/2020   AST 10 08/04/2020   ALKPHOS 71 08/04/2020   BILITOT 0.6 08/04/2020   Lab Results  Component Value Date   HGBA1C 4.5 (L) 08/04/2020   HGBA1C <4.2 (L) 04/14/2019   Lab Results  Component Value Date   INSULIN 15.7 08/04/2020   INSULIN 13.2 08/27/2019   Lab Results  Component Value Date   TSH 1.100 08/04/2020   Lab Results  Component Value Date   CHOL 191 06/27/2020   HDL 59 06/27/2020   LDLCALC 119 (H) 06/27/2020   TRIG 72 06/27/2020   CHOLHDL 3.2 06/27/2020  Lab Results  Component Value Date   WBC 5.8 06/27/2020   HGB 11.2 06/27/2020   HCT 35.8 06/27/2020   MCV 85 06/27/2020   PLT 315 06/27/2020   No results found for: IRON, TIBC, FERRITIN  Attestation Statements:   Reviewed by clinician on day of visit: allergies, medications, problem list, medical history, surgical history, family history, social history, and previous encounter notes.  Time spent on visit including  pre-visit chart review and post-visit care and charting was 36 minutes.    I, Burt Knack, am acting as transcriptionist for Quillian Quince, MD.  I have reviewed the above documentation for accuracy and completeness, and I agree with the above. -  Quillian Quince, MD

## 2020-11-30 ENCOUNTER — Encounter: Payer: Self-pay | Admitting: Nurse Practitioner

## 2020-12-01 DIAGNOSIS — F4323 Adjustment disorder with mixed anxiety and depressed mood: Secondary | ICD-10-CM | POA: Diagnosis not present

## 2020-12-06 ENCOUNTER — Ambulatory Visit: Payer: Medicaid Other | Admitting: Nurse Practitioner

## 2020-12-13 ENCOUNTER — Ambulatory Visit (INDEPENDENT_AMBULATORY_CARE_PROVIDER_SITE_OTHER): Payer: Medicaid Other | Admitting: Physician Assistant

## 2020-12-13 ENCOUNTER — Encounter (INDEPENDENT_AMBULATORY_CARE_PROVIDER_SITE_OTHER): Payer: Self-pay | Admitting: Physician Assistant

## 2020-12-13 ENCOUNTER — Other Ambulatory Visit: Payer: Self-pay

## 2020-12-13 VITALS — BP 130/84 | HR 86 | Temp 98.8°F | Ht 66.0 in | Wt 256.0 lb

## 2020-12-13 DIAGNOSIS — E8881 Metabolic syndrome: Secondary | ICD-10-CM | POA: Diagnosis not present

## 2020-12-13 DIAGNOSIS — Z6841 Body Mass Index (BMI) 40.0 and over, adult: Secondary | ICD-10-CM

## 2020-12-13 DIAGNOSIS — E559 Vitamin D deficiency, unspecified: Secondary | ICD-10-CM

## 2020-12-13 MED ORDER — METFORMIN HCL 500 MG PO TABS: ORAL_TABLET | ORAL | 0 refills | Status: DC

## 2020-12-13 MED ORDER — VITAMIN D (ERGOCALCIFEROL) 1.25 MG (50000 UNIT) PO CAPS: ORAL_CAPSULE | ORAL | 0 refills | Status: DC

## 2020-12-14 DIAGNOSIS — F4323 Adjustment disorder with mixed anxiety and depressed mood: Secondary | ICD-10-CM | POA: Diagnosis not present

## 2020-12-14 NOTE — Progress Notes (Signed)
Chief Complaint:   OBESITY Tricia Curry is here to discuss her progress with her obesity treatment plan along with follow-up of her obesity related diagnoses. Tricia Curry is on practicing portion control and making smarter food choices, such as increasing vegetables and decreasing simple carbohydrates and states she is following her eating plan approximately 90% of the time. Tricia Curry states she is walking for 15-25 minutes 2 times per week.  Today's visit was #: 9 Starting weight: 260 lbs Starting date: 08/04/2020 Today's weight: 256 lbs Today's date: 12/13/2020 Total lbs lost to date: 4 Total lbs lost since last in-office visit: 0  Interim History: Tricia Curry reports that she feels like she did better on a Category plan and would like to go back to that. Category 2 plan was reviewed in detail today.  Subjective:   1. Insulin resistance [name] reports that she has a history of hypoglycemia, and checks her BGs. She is on metformin.  2. Vitamin D deficiency Tricia Curry is on Vit D, and she denies nausea, vomiting, or muscle weakness. Last Vit D was not yet at goal.  Assessment/Plan:   1. Insulin resistance Tricia Curry will continue to work on weight loss, exercise, and decreasing simple carbohydrates to help decrease the risk of diabetes. We will refill metformin for 1 month. Tricia Curry agreed to follow-up with Tricia Curry as directed to closely monitor her progress.  - metFORMIN (GLUCOPHAGE) 500 MG tablet; TAKE 1 TABLET(500 MG) BY MOUTH DAILY WITH BREAKFAST  Dispense: 30 tablet; Refill: 0  2. Vitamin D deficiency Low Vitamin D level contributes to fatigue and are associated with obesity, breast, and colon cancer. We will refill prescription Vitamin D for 1 month. [name] will follow-up for routine testing of Vitamin D, at least 2-3 times per year to avoid over-replacement.  - Vitamin D, Ergocalciferol, (DRISDOL) 1.25 MG (50000 UNIT) CAPS capsule; TAKE 1 CAPSULE BY MOUTH EVERY 7 DAYS  Dispense: 4  capsule; Refill: 0  3. Class 3 severe obesity with serious comorbidity and body mass index (BMI) of 40.0 to 44.9 in adult, unspecified obesity type (HCC) Tricia Curry is currently in the action stage of change. As such, her goal is to continue with weight loss efforts. She has agreed to the Category 2 Plan.   Exercise goals: As is.  Behavioral modification strategies: meal planning and cooking strategies and keeping healthy foods in the home.  Tricia Curry has agreed to follow-up with our clinic in 2 weeks. She was informed of the importance of frequent follow-up visits to maximize her success with intensive lifestyle modifications for her multiple health conditions.   Objective:   Blood pressure 130/84, pulse 86, temperature 98.8 F (37.1 C), height 5\' 6"  (1.676 m), weight 256 lb (116.1 kg), SpO2 100 %. Body mass index is 41.32 kg/m.  General: Cooperative, alert, well developed, in no acute distress. HEENT: Conjunctivae and lids unremarkable. Cardiovascular: Regular rhythm.  Lungs: Normal work of breathing. Neurologic: No focal deficits.   Lab Results  Component Value Date   CREATININE 0.64 08/04/2020   BUN 8 08/04/2020   NA 138 08/04/2020   K 4.4 08/04/2020   CL 103 08/04/2020   CO2 23 08/04/2020   Lab Results  Component Value Date   ALT 9 08/04/2020   AST 10 08/04/2020   ALKPHOS 71 08/04/2020   BILITOT 0.6 08/04/2020   Lab Results  Component Value Date   HGBA1C 4.5 (L) 08/04/2020   HGBA1C <4.2 (L) 04/14/2019   Lab Results  Component Value Date   INSULIN  15.7 08/04/2020   INSULIN 13.2 08/27/2019   Lab Results  Component Value Date   TSH 1.100 08/04/2020   Lab Results  Component Value Date   CHOL 191 06/27/2020   HDL 59 06/27/2020   LDLCALC 119 (H) 06/27/2020   TRIG 72 06/27/2020   CHOLHDL 3.2 06/27/2020   Lab Results  Component Value Date   WBC 5.8 06/27/2020   HGB 11.2 06/27/2020   HCT 35.8 06/27/2020   MCV 85 06/27/2020   PLT 315 06/27/2020   No  results found for: IRON, TIBC, FERRITIN  Attestation Statements:   Reviewed by clinician on day of visit: allergies, medications, problem list, medical history, surgical history, family history, social history, and previous encounter notes.   Trude Mcburney, am acting as transcriptionist for [employer], PA-C.  I have reviewed the above documentation for accuracy and completeness, and I agree with the above. Alois Cliche, PA-C

## 2020-12-15 ENCOUNTER — Other Ambulatory Visit: Payer: Self-pay

## 2020-12-15 ENCOUNTER — Encounter: Payer: Self-pay | Admitting: Nurse Practitioner

## 2020-12-15 ENCOUNTER — Ambulatory Visit: Payer: Medicaid Other | Admitting: Nurse Practitioner

## 2020-12-15 VITALS — BP 118/80 | HR 88 | Temp 98.3°F | Ht 66.0 in | Wt 260.0 lb

## 2020-12-15 DIAGNOSIS — E7800 Pure hypercholesterolemia, unspecified: Secondary | ICD-10-CM

## 2020-12-15 DIAGNOSIS — G4709 Other insomnia: Secondary | ICD-10-CM

## 2020-12-15 DIAGNOSIS — Z6841 Body Mass Index (BMI) 40.0 and over, adult: Secondary | ICD-10-CM | POA: Diagnosis not present

## 2020-12-15 NOTE — Progress Notes (Signed)
I,Yamilka Roman [employer] as a Education administrator for [employer], FNP.,have documented all relevant documentation on the behalf of Minette Brine, FNP,as directed by  Minette Brine, FNP while in the presence of Minette Brine, [place]. This visit occurred during the SARS-CoV-2 public health emergency.  Safety protocols were in place, including screening questions prior to the visit, additional usage of staff PPE, and extensive cleaning of exam room while observing appropriate contact time as indicated for disinfecting solutions.  Subjective:     Patient ID: Tricia Curry , female    DOB: 12/18/1975 , 45 y.o.   MRN: [id number]   Chief Complaint  Patient presents with  . Hyperlipidemia    HPI  Patient presents today for a chol f/u . Her anxiety has been up this week. She sleeps less than 6 hours nightly. 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 Wt Readings from Last 3 Encounters: 12/15/20 : 260 lb (117.9 kg) 12/13/20 : 256 lb (116.1 kg) 11/28/20 : 256 lb (116.1 kg)   Hyperlipidemia This is a chronic problem. The current episode started more than 1 year ago. The problem is controlled. Recent lipid tests were reviewed and are normal. There are no known factors aggravating her hyperlipidemia.     Past Medical History:  Diagnosis Date  . Anemia   . Anxiety   . Asthma   . Back pain   . Constipation   . Depression   . Fatigue   . Food allergy, peanut   . GERD (gastroesophageal reflux disease)   . Joint pain   . Shortness of breath   . Shortness of breath on exertion   . Swelling of both lower extremities   . Vitamin D deficiency      Family History  Problem Relation Age of Onset  . Stroke Maternal Aunt   . Breast cancer Sister   . Hypertension Mother   . Depression Mother   . Anxiety disorder Mother   . Sleep apnea Mother      Current Outpatient Medications:  .  ACCU-CHEK GUIDE test  strip, USE UP TO FOUR TIMES DAILY AS NEEDED, Disp: 100 strip, Rfl: 2 .  Accu-Chek Softclix Lancets lancets, USE UP TO FOUR TIMES DAILY AS DIRECTED, Disp: 100 each, Rfl: 2 .  albuterol (VENTOLIN HFA) 108 (90 Base) MCG/ACT inhaler, INHALE 1 TO 2 PUFFS INTO THE LUNGS EVERY 6 HOURS AS NEEDED FOR WHEEZING OR SHORTNESS OF BREATH, Disp: 18 g, Rfl: 2 .  blood glucose meter kit and supplies KIT, Dispense based on patient and insurance preference. Use up to four times daily as directed. (FOR ICD-9 250.00, 250.01)., Disp: 1 each, Rfl: 0 .  diphenhydrAMINE (BENADRYL) 25 mg capsule, Take 25 mg by mouth daily as needed for allergies., Disp: , Rfl:  .  glucose blood (ACCU-CHEK GUIDE) test strip, USE UP TO FOUR TIMES DAILY AS NEEDED, Disp: 100 strip, Rfl: 2 .  metFORMIN (GLUCOPHAGE) 500 MG tablet, TAKE 1 TABLET(500 MG) BY MOUTH DAILY WITH BREAKFAST, Disp: 30 tablet, Rfl: 0 .  olopatadine (PATANOL) 0.1 % ophthalmic solution, Place 1 drop into both eyes daily as needed for allergies., Disp: , Rfl:  .  valACYclovir (VALTREX) 500 MG tablet, TAKE 1 TABLET(500 MG) BY MOUTH TWICE DAILY, Disp: 60 tablet, Rfl: 3 .  Vitamin D, Ergocalciferol, (DRISDOL) 1.25 MG (50000 UNIT) CAPS capsule, TAKE 1 CAPSULE BY MOUTH EVERY 7 DAYS,  Disp: 4 capsule, Rfl: 0   Allergies  Allergen Reactions  . Peanut-Containing Drug Products Anaphylaxis     Review of Systems  Constitutional: Negative.   HENT: Negative.   Eyes: Negative.   Respiratory: Negative.   Cardiovascular: Negative.   Gastrointestinal: Negative.   Endocrine: Negative.   Genitourinary: Negative.   Musculoskeletal: Negative.   Skin: Negative.   Neurological: Negative.   Hematological: Negative.   Psychiatric/Behavioral: Negative.      Today's Vitals   12/15/20 1547  BP: 118/80  Pulse: 88  Temp: 98.3 F (36.8 C)  TempSrc: Oral  Weight: 260 lb (117.9 kg)  Height: '5\' 6"'  (1.676 m)  PainSc: 0-No pain   Body mass index is 41.97 kg/m.   Objective:  Physical  Exam Constitutional:      General: She is not in acute distress.    Appearance: Normal appearance. She is obese.  Cardiovascular:     Rate and Rhythm: Normal rate and regular rhythm.     Pulses: Normal pulses.     Heart sounds: Normal heart sounds.  Pulmonary:     Effort: Pulmonary effort is normal.     Breath sounds: Normal breath sounds.  Abdominal:     General: Abdomen is flat. Bowel sounds are normal.     Palpations: Abdomen is soft.  Musculoskeletal:     Cervical back: Normal range of motion and neck supple.  Skin:    General: Skin is warm and dry.     Capillary Refill: Capillary refill takes less than 2 seconds.  Neurological:     General: No focal deficit present.     Mental Status: She is alert and oriented to person, place, and time.  Psychiatric:        Mood and Affect: Mood normal.        Behavior: Behavior normal.        Thought Content: Thought content normal.        Judgment: Judgment normal.         Assessment And Plan:     1. Elevated cholesterol  Chronic, controlled  No current medications, diet controlled  Encouraged to avoid fried and fatty foods. - Lipid panel  2. Class 3 severe obesity due to excess calories without serious comorbidity with body mass index (BMI) of 40.0 to 44.9 in adult [facility])  Chronic  Discussed healthy diet and regular exercise options   Encouraged to exercise at least 150 minutes per week with 2 days of strength training  She is encouraged to strive for BMI less than 30 to decrease cardiac risk. Advised to aim for at least 150 minutes of exercise per week.  She was congratulated on losing 5 lbs   [facility] to [facility]  Wt Readings from Last 3 Encounters:  12/27/20 255 lb (115.7 kg)  12/15/20 260 lb (117.9 kg)  12/13/20 256 lb (116.1 kg)    3. Other insomnia  Intermittent, she is encouraged to try melatonin over the counter for sleep  If not better will consider  other medications  The problem of recurrent insomnia is discussed. Avoidance of caffeine sources is strongly encouraged. Sleep hygiene issues are reviewed.    Patient was given opportunity to ask questions. Patient verbalized understanding of the plan and was able to repeat key elements of the plan. All questions were answered to their satisfaction.  Minette Brine, FNP   I, Minette Brine, FNP, have reviewed all documentation for this visit.  The documentation on 01/09/21 for the exam, diagnosis, procedures, and orders are all accurate and complete.   THE PATIENT IS ENCOURAGED TO PRACTICE SOCIAL DISTANCING DUE TO THE COVID-19 PANDEMIC.

## 2020-12-16 LAB — LIPID PANEL
Chol/HDL Ratio: 2.9 ratio (ref 0.0–4.4)
Cholesterol, Total: 170 mg/dL (ref 100–199)
HDL: 59 mg/dL (ref >39)
LDL Chol Calc (NIH): 99 mg/dL (ref 0–99)
Triglycerides: 61 mg/dL (ref 0–149)
VLDL Cholesterol Cal: 12 mg/dL (ref 5–40)

## 2020-12-20 ENCOUNTER — Other Ambulatory Visit (INDEPENDENT_AMBULATORY_CARE_PROVIDER_SITE_OTHER): Payer: Self-pay | Admitting: Family Medicine

## 2020-12-20 DIAGNOSIS — E8881 Metabolic syndrome: Secondary | ICD-10-CM

## 2020-12-20 NOTE — Telephone Encounter (Signed)
Last seen by Tracey Aguilar, PA-C. 

## 2020-12-27 ENCOUNTER — Encounter (INDEPENDENT_AMBULATORY_CARE_PROVIDER_SITE_OTHER): Payer: Self-pay | Admitting: Family Medicine

## 2020-12-27 ENCOUNTER — Ambulatory Visit (INDEPENDENT_AMBULATORY_CARE_PROVIDER_SITE_OTHER): Payer: Medicaid Other | Admitting: Family Medicine

## 2020-12-27 ENCOUNTER — Other Ambulatory Visit: Payer: Self-pay

## 2020-12-27 VITALS — BP 116/73 | HR 86 | Temp 98.1°F | Ht 66.0 in | Wt 255.0 lb

## 2020-12-27 DIAGNOSIS — E559 Vitamin D deficiency, unspecified: Secondary | ICD-10-CM | POA: Diagnosis not present

## 2020-12-27 DIAGNOSIS — Z6841 Body Mass Index (BMI) 40.0 and over, adult: Secondary | ICD-10-CM

## 2020-12-28 DIAGNOSIS — F4323 Adjustment disorder with mixed anxiety and depressed mood: Secondary | ICD-10-CM | POA: Diagnosis not present

## 2021-01-02 NOTE — Progress Notes (Signed)
Chief Complaint:   OBESITY Tricia Curry is here to discuss her progress with her obesity treatment plan along with follow-up of her obesity related diagnoses. Tricia Curry is on the Category 2 Plan and states she is following her eating plan approximately 80% of the time. Tricia Curry states she is doing 0 minutes 0 times per week.  Today's visit was #: 10 Starting weight: 260 lbs Starting date: 08/04/2020 Today's weight: 255 lbs Today's date: 12/27/2020 Total lbs lost to date: 5 Total lbs lost since last in-office visit: 1  Interim History: Tricia Curry continues to do well with weight loss, and trying to increase lean protein. Her hunger is controlled and she is cutting back some of her work hours as it has been very stressful.  Subjective:   1. Vitamin D deficiency Tricia Curry is stable on Vit D. She has found a strategy to help her take her weekly dose more frequently. She denies nausea, vomiting, or muscle weakness.  Assessment/Plan:   1. Vitamin D deficiency Low Vitamin D level contributes to fatigue and are associated with obesity, breast, and colon cancer. Tricia Curry agreed to continue taking prescription Vitamin D 50,000 IU every week, and we will plan to recheck labs in 2 months. She will follow-up for routine testing of Vitamin D, at least 2-3 times per year to avoid over-replacement.  2. Class 3 severe obesity with serious comorbidity and body mass index (BMI) of 40.0 to 44.9 in adult, unspecified obesity type (HCC) Tricia Curry is currently in the action stage of change. As such, her goal is to continue with weight loss efforts. She has agreed to the Category 2 Plan.   Behavioral modification strategies: emotional eating strategies.  Tricia Curry has agreed to follow-up with our clinic in 2 weeks. She was informed of the importance of frequent follow-up visits to maximize her success with intensive lifestyle modifications for her multiple health conditions.   Objective:   Blood pressure  116/73, pulse 86, temperature 98.1 F (36.7 C), height 5\' 6"  (1.676 m), weight 255 lb (115.7 kg), SpO2 100 %. Body mass index is 41.16 kg/m.  General: Cooperative, alert, well developed, in no acute distress. HEENT: Conjunctivae and lids unremarkable. Cardiovascular: Regular rhythm.  Lungs: Normal work of breathing. Neurologic: No focal deficits.   Lab Results  Component Value Date   CREATININE 0.64 08/04/2020   BUN 8 08/04/2020   NA 138 08/04/2020   K 4.4 08/04/2020   CL 103 08/04/2020   CO2 23 08/04/2020   Lab Results  Component Value Date   ALT 9 08/04/2020   AST 10 08/04/2020   ALKPHOS 71 08/04/2020   BILITOT 0.6 08/04/2020   Lab Results  Component Value Date   HGBA1C 4.5 (L) 08/04/2020   HGBA1C <4.2 (L) 04/14/2019   Lab Results  Component Value Date   INSULIN 15.7 08/04/2020   INSULIN 13.2 08/27/2019   Lab Results  Component Value Date   TSH 1.100 08/04/2020   Lab Results  Component Value Date   CHOL 170 12/15/2020   HDL 59 12/15/2020   LDLCALC 99 12/15/2020   TRIG 61 12/15/2020   CHOLHDL 2.9 12/15/2020   Lab Results  Component Value Date   WBC 5.8 06/27/2020   HGB 11.2 06/27/2020   HCT 35.8 06/27/2020   MCV 85 06/27/2020   PLT 315 06/27/2020   No results found for: IRON, TIBC, FERRITIN  Attestation Statements:   Reviewed by clinician on day of visit: allergies, medications, problem list, medical history, surgical history, family  history, social history, and previous encounter notes.  Time spent on visit including pre-visit chart review and post-visit care and charting was 20 minutes.    I, Burt Knack, am acting as transcriptionist for Quillian Quince, MD.  I have reviewed the above documentation for accuracy and completeness, and I agree with the above. -  Quillian Quince, MD

## 2021-01-10 ENCOUNTER — Ambulatory Visit (INDEPENDENT_AMBULATORY_CARE_PROVIDER_SITE_OTHER): Payer: Medicaid Other | Admitting: Physician Assistant

## 2021-01-10 ENCOUNTER — Encounter (INDEPENDENT_AMBULATORY_CARE_PROVIDER_SITE_OTHER): Payer: Self-pay | Admitting: Physician Assistant

## 2021-01-10 ENCOUNTER — Other Ambulatory Visit: Payer: Self-pay

## 2021-01-10 VITALS — BP 110/76 | HR 76 | Temp 99.2°F | Ht 66.0 in | Wt 251.0 lb

## 2021-01-10 DIAGNOSIS — Z6841 Body Mass Index (BMI) 40.0 and over, adult: Secondary | ICD-10-CM | POA: Diagnosis not present

## 2021-01-10 DIAGNOSIS — E8881 Metabolic syndrome: Secondary | ICD-10-CM

## 2021-01-10 DIAGNOSIS — E559 Vitamin D deficiency, unspecified: Secondary | ICD-10-CM

## 2021-01-10 MED ORDER — METFORMIN HCL 500 MG PO TABS: ORAL_TABLET | ORAL | 0 refills | Status: DC

## 2021-01-10 MED ORDER — VITAMIN D (ERGOCALCIFEROL) 1.25 MG (50000 UNIT) PO CAPS: ORAL_CAPSULE | ORAL | 0 refills | Status: DC

## 2021-01-11 DIAGNOSIS — F4323 Adjustment disorder with mixed anxiety and depressed mood: Secondary | ICD-10-CM | POA: Diagnosis not present

## 2021-01-11 LAB — COMPREHENSIVE METABOLIC PANEL
ALT: 12 IU/L (ref 0–32)
AST: 11 IU/L (ref 0–40)
Albumin/Globulin Ratio: 1 — ABNORMAL LOW (ref 1.2–2.2)
Albumin: 3.9 g/dL (ref 3.8–4.8)
Alkaline Phosphatase: 69 IU/L (ref 44–121)
BUN/Creatinine Ratio: 19 (ref 9–23)
BUN: 13 mg/dL (ref 6–24)
Bilirubin Total: 0.5 mg/dL (ref 0.0–1.2)
CO2: 21 mmol/L (ref 20–29)
Calcium: 8.9 mg/dL (ref 8.7–10.2)
Chloride: 104 mmol/L (ref 96–106)
Creatinine, Ser: 0.69 mg/dL (ref 0.57–1.00)
Globulin, Total: 3.8 g/dL (ref 1.5–4.5)
Glucose: 79 mg/dL (ref 65–99)
Potassium: 4.8 mmol/L (ref 3.5–5.2)
Sodium: 140 mmol/L (ref 134–144)
Total Protein: 7.7 g/dL (ref 6.0–8.5)
eGFR: 110 mL/min/{1.73_m2} (ref >59)

## 2021-01-11 LAB — VITAMIN D 25 HYDROXY (VIT D DEFICIENCY, FRACTURES): Vit D, 25-Hydroxy: 37 ng/mL (ref 30.0–100.0)

## 2021-01-11 LAB — INSULIN, RANDOM: INSULIN: 8.7 u[IU]/mL (ref 2.6–24.9)

## 2021-01-15 ENCOUNTER — Other Ambulatory Visit (INDEPENDENT_AMBULATORY_CARE_PROVIDER_SITE_OTHER): Payer: Self-pay | Admitting: Physician Assistant

## 2021-01-15 DIAGNOSIS — E559 Vitamin D deficiency, unspecified: Secondary | ICD-10-CM

## 2021-01-16 ENCOUNTER — Other Ambulatory Visit (INDEPENDENT_AMBULATORY_CARE_PROVIDER_SITE_OTHER): Payer: Self-pay | Admitting: Physician Assistant

## 2021-01-16 DIAGNOSIS — E8881 Metabolic syndrome: Secondary | ICD-10-CM

## 2021-01-16 NOTE — Progress Notes (Signed)
Chief Complaint:   OBESITY Tricia [facility] is here to discuss her progress with her obesity treatment plan along with follow-up of her obesity related diagnoses. Tricia [facility] is on the Category 2 Plan and states she is following her eating plan approximately 80% of the time. [facility] states she is walking for 30 minutes 3 times per week.  Today's visit was #: 11 Starting weight: 260 lbs Starting date: 08/04/2020 Today's weight: 251 lbs Today's date: 01/10/2021 Total lbs lost to date: 9 Total lbs lost since last in-office visit: 4  Interim History: [facility] did well with weight loss. She has been walking more at work. She is eating more protein and doing better with meal prep.  Subjective:   1. Insulin resistance T[facility] is on metformin, and she denies nausea, vomiting, diarrhea, or polyphagia.  2. Vitamin D deficiency T[facility] is on Vit D, and she is tolerating it well.  Assessment/Plan:   1. Insulin resistance Tricia Curry will continue to work on weight loss, exercise, and decreasing simple carbohydrates to help decrease the risk of diabetes. We will check labs today, and we will refill metformin for 1 month. Tricia Curry agreed to follow-up with [name] as directed to closely monitor her progress.  - Comprehensive metabolic panel - Insulin, random - metFORMIN (GLUCOPHAGE) 500 MG tablet; TAKE 1 TABLET(500 MG) BY MOUTH DAILY WITH BREAKFAST  Dispense: 30 tablet; Refill: 0  2. Vitamin D deficiency Low Vitamin D level contributes to fatigue and are associated with obesity, breast, and colon cancer. We will check labs today, and we will refill prescription Vitamin D for 1 month. Tricia Curry will follow-up for routine testing of Vitamin D, at least 2-3 times per year to avoid over-replacement.  - VITAMIN D 25 Hydroxy (Vit-D Deficiency, Fractures) - Vitamin D, Ergocalciferol, (DRISDOL) 1.25 MG (50000 UNIT) CAPS capsule; TAKE 1 CAPSULE BY MOUTH EVERY 7 DAYS  Dispense: 4 capsule; Refill: 0  3. Class 3  severe obesity with serious comorbidity and body mass index (BMI) of 40.0 to 44.9 in adult, unspecified obesity type (HCC) T[facility] is currently in the action stage of change. As such, her goal is to continue with weight loss efforts. She has agreed to the Category 2 Plan.   Exercise goals: As is.  Behavioral modification strategies: meal planning and cooking strategies and keeping healthy foods in the home.  Tricia [facility] has agreed to follow-up with our clinic in 2 weeks. She was informed of the importance of frequent follow-up visits to maximize her success with intensive lifestyle modifications for her multiple health conditions.   Tricia Curry was informed we would discuss her lab results at her next visit unless there is a critical issue that needs to be addressed sooner. Tricia Curry agreed to keep her next visit at the agreed upon time to discuss these results.  Objective:   Blood pressure 110/76, pulse 76, temperature 99.2 F (37.3 C), height 5\' 6"  (1.676 m), weight 251 lb (113.9 kg), SpO2 99 %. Body mass index is 40.51 kg/m.  General: Cooperative, alert, well developed, in no acute distress. HEENT: Conjunctivae and lids unremarkable. Cardiovascular: Regular rhythm.  Lungs: Normal work of breathing. Neurologic: No focal deficits.   Lab Results  Component Value Date   CREATININE 0.69 01/10/2021   BUN 13 01/10/2021   NA 140 01/10/2021   K 4.8 01/10/2021   CL 104 01/10/2021   CO2 21 01/10/2021   Lab Results  Component Value Date   ALT 12 01/10/2021   AST 11 01/10/2021   ALKPHOS 69  01/10/2021   BILITOT 0.5 01/10/2021   Lab Results  Component Value Date   HGBA1C 4.5 (L) 08/04/2020   HGBA1C <4.2 (L) 04/14/2019   Lab Results  Component Value Date   INSULIN 8.7 01/10/2021   INSULIN 15.7 08/04/2020   INSULIN 13.2 08/27/2019   Lab Results  Component Value Date   TSH 1.100 08/04/2020   Lab Results  Component Value Date   CHOL 170 12/15/2020   HDL 59 12/15/2020   LDLCALC 99  12/15/2020   TRIG 61 12/15/2020   CHOLHDL 2.9 12/15/2020   Lab Results  Component Value Date   WBC 5.8 06/27/2020   HGB 11.2 06/27/2020   HCT 35.8 06/27/2020   MCV 85 06/27/2020   PLT 315 06/27/2020   No results found for: IRON, TIBC, FERRITIN  Attestation Statements:   Reviewed by clinician on day of visit: allergies, medications, problem list, medical history, surgical history, family history, social history, and previous encounter notes.   Trude Mcburney, am acting as transcriptionist for [employer], PA-C.  I have reviewed the above documentation for accuracy and completeness, and I agree with the above. Alois Cliche, PA-C

## 2021-01-16 NOTE — Telephone Encounter (Signed)
Last seen by Tracey Aguilar, PA-C. 

## 2021-01-25 DIAGNOSIS — F4323 Adjustment disorder with mixed anxiety and depressed mood: Secondary | ICD-10-CM | POA: Diagnosis not present

## 2021-01-30 ENCOUNTER — Encounter (INDEPENDENT_AMBULATORY_CARE_PROVIDER_SITE_OTHER): Payer: Self-pay | Admitting: Family Medicine

## 2021-01-30 ENCOUNTER — Ambulatory Visit (INDEPENDENT_AMBULATORY_CARE_PROVIDER_SITE_OTHER): Payer: Medicaid Other | Admitting: Family Medicine

## 2021-01-30 ENCOUNTER — Other Ambulatory Visit: Payer: Self-pay

## 2021-01-30 VITALS — BP 131/84 | HR 71 | Temp 98.4°F | Ht 66.0 in | Wt 255.0 lb

## 2021-01-30 DIAGNOSIS — Z6841 Body Mass Index (BMI) 40.0 and over, adult: Secondary | ICD-10-CM | POA: Diagnosis not present

## 2021-01-30 DIAGNOSIS — E8881 Metabolic syndrome: Secondary | ICD-10-CM

## 2021-02-02 ENCOUNTER — Ambulatory Visit (INDEPENDENT_AMBULATORY_CARE_PROVIDER_SITE_OTHER): Payer: Medicaid Other | Admitting: Physician Assistant

## 2021-02-02 NOTE — Progress Notes (Signed)
Chief Complaint:   OBESITY [facility] is here to discuss her progress with her obesity treatment plan along with follow-up of her obesity related diagnoses. Tricia Curry is on the Category 2 Plan and states she is following her eating plan approximately 85-90% of the time. Tricia [facility] states she is walking for 30 minutes 3 times per week.  Today's visit was #: 12 Starting weight: 260 lbs Starting date: 08/04/2020 Today's weight: 255 lbs Today's date: 01/30/2021 Total lbs lost to date: 5 Total lbs lost since last in-office visit: 0  Interim History: T[facility] appears to be retaining fluid today, and she is up 4 lbs of water weight. She did some celebrations eating over Easter, but otherwise she is trying to meal prep.  Subjective:   1. Insulin resistance T[facility] is on metformin, and she is working on diet and decreasing simple carbohydrates. Her protein may be starting to drift down.  Assessment/Plan:   1. Insulin resistance Tricia Curry will continue diet, exercise, metformin, and decreasing simple carbohydrates to help decrease the risk of diabetes. Nutritional education was given today. Tricia Curry agreed to follow-up with [facility] as directed to closely monitor her progress.  2. Obesity with current BMI of 41.3 Tricia Curry is currently in the action stage of change. As such, her goal is to continue with weight loss efforts. She has agreed to the Category 2 Plan and keeping a food journal and adhering to recommended goals of 1200-1500 calories and 80+ grams of protein daily.   Exercise goals: As is.  Behavioral modification strategies: increasing lean protein intake, decreasing simple carbohydrates, increasing water intake and keeping a strict food journal.  [facility] has agreed to follow-up with our clinic in 2 to 3 weeks. She was informed of the importance of frequent follow-up visits to maximize her success with intensive lifestyle modifications for her multiple health conditions.   Objective:    Blood pressure 131/84, pulse 71, temperature 98.4 F (36.9 C), height 5\' 6"  (1.676 m), weight 255 lb (115.7 kg), SpO2 100 %. Body mass index is 41.16 kg/m.  General: Cooperative, alert, well developed, in no acute distress. HEENT: Conjunctivae and lids unremarkable. Cardiovascular: Regular rhythm.  Lungs: Normal work of breathing. Neurologic: No focal deficits.   Lab Results  Component Value Date   CREATININE 0.69 01/10/2021   BUN 13 01/10/2021   NA 140 01/10/2021   K 4.8 01/10/2021   CL 104 01/10/2021   CO2 21 01/10/2021   Lab Results  Component Value Date   ALT 12 01/10/2021   AST 11 01/10/2021   ALKPHOS 69 01/10/2021   BILITOT 0.5 01/10/2021   Lab Results  Component Value Date   HGBA1C 4.5 (L) 08/04/2020   HGBA1C <4.2 (L) 04/14/2019   Lab Results  Component Value Date   INSULIN 8.7 01/10/2021   INSULIN 15.7 08/04/2020   INSULIN 13.2 08/27/2019   Lab Results  Component Value Date   TSH 1.100 08/04/2020   Lab Results  Component Value Date   CHOL 170 12/15/2020   HDL 59 12/15/2020   LDLCALC 99 12/15/2020   TRIG 61 12/15/2020   CHOLHDL 2.9 12/15/2020   Lab Results  Component Value Date   WBC 5.8 06/27/2020   HGB 11.2 06/27/2020   HCT 35.8 06/27/2020   MCV 85 06/27/2020   PLT 315 06/27/2020   No results found for: IRON, TIBC, FERRITIN  Attestation Statements:   Reviewed by clinician on day of visit: allergies, medications, problem list, medical history, surgical history, family history, social  history, and previous encounter notes.  Time spent on visit including pre-visit chart review and post-visit care and charting was 30 minutes.    I, Trixie Dredge, am acting as transcriptionist for Dennard Nip, MD.  I have reviewed the above documentation for accuracy and completeness, and I agree with the above. -  Dennard Nip, MD

## 2021-02-07 DIAGNOSIS — F4323 Adjustment disorder with mixed anxiety and depressed mood: Secondary | ICD-10-CM | POA: Diagnosis not present

## 2021-02-14 DIAGNOSIS — F4323 Adjustment disorder with mixed anxiety and depressed mood: Secondary | ICD-10-CM | POA: Diagnosis not present

## 2021-02-15 ENCOUNTER — Encounter (INDEPENDENT_AMBULATORY_CARE_PROVIDER_SITE_OTHER): Payer: Self-pay | Admitting: Physician Assistant

## 2021-02-15 ENCOUNTER — Ambulatory Visit (INDEPENDENT_AMBULATORY_CARE_PROVIDER_SITE_OTHER): Payer: Medicaid Other | Admitting: Physician Assistant

## 2021-02-15 ENCOUNTER — Other Ambulatory Visit: Payer: Self-pay

## 2021-02-15 VITALS — BP 118/74 | HR 83 | Temp 98.3°F | Ht 66.0 in | Wt 259.0 lb

## 2021-02-15 DIAGNOSIS — Z6841 Body Mass Index (BMI) 40.0 and over, adult: Secondary | ICD-10-CM

## 2021-02-15 DIAGNOSIS — E8881 Metabolic syndrome: Secondary | ICD-10-CM

## 2021-02-15 MED ORDER — METFORMIN HCL 500 MG PO TABS: ORAL_TABLET | ORAL | 0 refills | Status: DC

## 2021-02-16 NOTE — Progress Notes (Signed)
Chief Complaint:   OBESITY T[facility] is here to discuss her progress with her obesity treatment plan along with follow-up of her obesity related diagnoses. Tricia Curry is on the Category 2 Plan or keeping a food journal and adhering to recommended goals of 1200-1500 calories and 80+ grams of protein daily and states she is following her eating plan approximately 80% of the time. Tricia Curry states she is walking for 30 minutes 4 times per week.  Today's visit was #: 13 Starting weight: 260 lbs Starting date: 08/04/2020 Today's weight: 259 lbs Today's date: 02/15/2021 Total lbs lost to date: 1 Total lbs lost since last in-office visit: 0  Interim History: Tricia Curry had a difficult time with journaling so she went back to Category 2. She reports that when she eats on the plan, and her hunger is controlled.  Subjective:   1. Insulin resistance Angle is on metformin, and she is tolerating it well. She denies polyphagia.  Assessment/Plan:   1. Insulin resistance Tricia Curry will continue to work on weight loss, exercise, and decreasing simple carbohydrates to help decrease the risk of diabetes. We will refill metformin for 1 month. Tricia Curry agreed to follow-up with [name] as directed to closely monitor her progress.  - metFORMIN (GLUCOPHAGE) 500 MG tablet; TAKE 1 TABLET(500 MG) BY MOUTH DAILY WITH BREAKFAST  Dispense: 30 tablet; Refill: 0  2. Class 3 severe obesity with serious comorbidity and body mass index (BMI) of 40.0 to 44.9 in adult, unspecified obesity type (HCC) Tricia Curry is currently in the action stage of change. As such, her goal is to continue with weight loss efforts. She has agreed to the Category 2 Plan.   Exercise goals: As is.  Behavioral modification strategies: no skipping meals and meal planning and cooking strategies.  Tricia [facility] has agreed to follow-up with our clinic in 2 weeks. She was informed of the importance of frequent follow-up visits to maximize her success with  intensive lifestyle modifications for her multiple health conditions.   Objective:   Blood pressure 118/74, pulse 83, temperature 98.3 F (36.8 C), height 5\' 6"  (1.676 m), weight 259 lb (117.5 kg), SpO2 98 %. Body mass index is 41.8 kg/m.  General: Cooperative, alert, well developed, in no acute distress. HEENT: Conjunctivae and lids unremarkable. Cardiovascular: Regular rhythm.  Lungs: Normal work of breathing. Neurologic: No focal deficits.   Lab Results  Component Value Date   CREATININE 0.69 01/10/2021   BUN 13 01/10/2021   NA 140 01/10/2021   K 4.8 01/10/2021   CL 104 01/10/2021   CO2 21 01/10/2021   Lab Results  Component Value Date   ALT 12 01/10/2021   AST 11 01/10/2021   ALKPHOS 69 01/10/2021   BILITOT 0.5 01/10/2021   Lab Results  Component Value Date   HGBA1C 4.5 (L) 08/04/2020   HGBA1C <4.2 (L) 04/14/2019   Lab Results  Component Value Date   INSULIN 8.7 01/10/2021   INSULIN 15.7 08/04/2020   INSULIN 13.2 08/27/2019   Lab Results  Component Value Date   TSH 1.100 08/04/2020   Lab Results  Component Value Date   CHOL 170 12/15/2020   HDL 59 12/15/2020   LDLCALC 99 12/15/2020   TRIG 61 12/15/2020   CHOLHDL 2.9 12/15/2020   Lab Results  Component Value Date   WBC 5.8 06/27/2020   HGB 11.2 06/27/2020   HCT 35.8 06/27/2020   MCV 85 06/27/2020   PLT 315 06/27/2020   No results found for: IRON, TIBC, FERRITIN  Attestation Statements:   Reviewed by clinician on day of visit: allergies, medications, problem list, medical history, surgical history, family history, social history, and previous encounter notes.   Wilhemena Durie, am acting as transcriptionist for [employer], PA-C.  I have reviewed the above documentation for accuracy and completeness, and I agree with the above. Abby Potash, PA-C

## 2021-02-17 ENCOUNTER — Other Ambulatory Visit (INDEPENDENT_AMBULATORY_CARE_PROVIDER_SITE_OTHER): Payer: Self-pay | Admitting: Physician Assistant

## 2021-02-17 DIAGNOSIS — E559 Vitamin D deficiency, unspecified: Secondary | ICD-10-CM

## 2021-02-20 NOTE — Telephone Encounter (Signed)
Tricia Curry 

## 2021-02-20 NOTE — Telephone Encounter (Signed)
Would you like to refill? 

## 2021-02-22 ENCOUNTER — Other Ambulatory Visit (INDEPENDENT_AMBULATORY_CARE_PROVIDER_SITE_OTHER): Payer: Self-pay | Admitting: Physician Assistant

## 2021-02-22 DIAGNOSIS — F4323 Adjustment disorder with mixed anxiety and depressed mood: Secondary | ICD-10-CM | POA: Diagnosis not present

## 2021-02-22 DIAGNOSIS — E8881 Metabolic syndrome: Secondary | ICD-10-CM

## 2021-02-27 ENCOUNTER — Encounter (INDEPENDENT_AMBULATORY_CARE_PROVIDER_SITE_OTHER): Payer: Self-pay | Admitting: Physician Assistant

## 2021-02-28 ENCOUNTER — Ambulatory Visit (INDEPENDENT_AMBULATORY_CARE_PROVIDER_SITE_OTHER): Payer: Medicaid Other | Admitting: Physician Assistant

## 2021-03-08 DIAGNOSIS — F4323 Adjustment disorder with mixed anxiety and depressed mood: Secondary | ICD-10-CM | POA: Diagnosis not present

## 2021-03-20 ENCOUNTER — Other Ambulatory Visit (INDEPENDENT_AMBULATORY_CARE_PROVIDER_SITE_OTHER): Payer: Self-pay | Admitting: Family Medicine

## 2021-03-20 DIAGNOSIS — E559 Vitamin D deficiency, unspecified: Secondary | ICD-10-CM

## 2021-03-21 ENCOUNTER — Other Ambulatory Visit (INDEPENDENT_AMBULATORY_CARE_PROVIDER_SITE_OTHER): Payer: Self-pay | Admitting: Physician Assistant

## 2021-03-21 DIAGNOSIS — E8881 Metabolic syndrome: Secondary | ICD-10-CM

## 2021-03-21 NOTE — Telephone Encounter (Signed)
Last seen by Jerelene Redden, PA-C

## 2021-03-22 DIAGNOSIS — F4323 Adjustment disorder with mixed anxiety and depressed mood: Secondary | ICD-10-CM | POA: Diagnosis not present

## 2021-03-26 ENCOUNTER — Other Ambulatory Visit: Payer: Self-pay

## 2021-03-26 ENCOUNTER — Encounter (HOSPITAL_COMMUNITY): Payer: Self-pay | Admitting: Emergency Medicine

## 2021-03-26 ENCOUNTER — Emergency Department (HOSPITAL_COMMUNITY)
Admission: EM | Admit: 2021-03-26 | Discharge: 2021-03-26 | Disposition: A | Discharge status: Home / Self Care | Payer: Medicaid Other | Attending: Emergency Medicine | Admitting: Emergency Medicine

## 2021-03-26 DIAGNOSIS — M25561 Pain in right knee: Secondary | ICD-10-CM | POA: Insufficient documentation

## 2021-03-26 DIAGNOSIS — M25562 Pain in left knee: Secondary | ICD-10-CM | POA: Diagnosis not present

## 2021-03-26 DIAGNOSIS — Z91010 Allergy to peanuts: Secondary | ICD-10-CM | POA: Insufficient documentation

## 2021-03-26 DIAGNOSIS — Z87891 Personal history of nicotine dependence: Secondary | ICD-10-CM | POA: Diagnosis not present

## 2021-03-26 DIAGNOSIS — J45909 Unspecified asthma, uncomplicated: Secondary | ICD-10-CM | POA: Diagnosis not present

## 2021-03-26 MED ORDER — DICLOFENAC SODIUM 1 % EX GEL: 2.0000 g | Freq: Four times a day (QID) | CUTANEOUS | 1 refills | Status: AC

## 2021-03-26 NOTE — Discharge Instructions (Addendum)
I recommend a combination of tylenol and ibuprofen for management of your pain. You can take a low dose of both at the same time. I recommend 500 mg of Tylenol combined with 600 mg of ibuprofen. This is one maximum strength Tylenol and three regular ibuprofen. You can take these 2-3 times for day for your pain. Please try to take these medications with a small amount of food as well to prevent upsetting your stomach.  I am also prescribing you Voltaren gel.  You can apply this to both knees as needed to help manage her pain.  Below is the contact information for Dr. Eulah Pont.  He is a Investment banker, corporate.  Please give them a call in 1 week if your symptoms have not improved.  If they worsen, you can always come back to the emergency department.  It was a pleasure to meet you.

## 2021-03-26 NOTE — ED Triage Notes (Signed)
Patient c/o bilateral knee pain after fall x1 week ago at work. Ambulatory. Reports swelling to knees at baseline from history of arthritis.

## 2021-03-26 NOTE — ED Provider Notes (Signed)
[facility] DEPT Provider Note   CSN: [id number] Arrival date & time: 03/26/21  1255     History Chief Complaint  Patient presents with   Knee Pain    Tricia Curry is a 45 y.o. female.  HPI Patient is a 45 year old female who presents to the emergency department due to bilateral knee pain.  She states that about 10 days ago she slipped and began feeling pain along the left medial knee.  Over time she then began experiencing pain in the right medial knee.  Pain worsens with ambulation as well as flexion/extension of the knees.  She reports a history of chronic pain in the joints but states that this is worse than normal.  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  She has been wearing knee braces throughout the day to help with edema in the joints and also elevating them at night.  She has been taking ibuprofen with mild short-term relief.  Denies any numbness, fevers, or weakness.    Past Medical History:  Diagnosis Date   Anemia    Anxiety    Asthma    Back pain    Constipation    Depression    Fatigue    Food allergy, peanut    GERD (gastroesophageal reflux disease)    Joint pain    Shortness of breath    Shortness of breath on exertion    Swelling of both lower extremities    Vitamin D deficiency     Patient Active Problem List   Diagnosis Date Noted   Insulin resistance 08/29/2020   Depression 04/14/2019   Iron deficiency anemia secondary to inadequate dietary iron intake 01/27/2019   Palpitations 01/27/2019   Class 3 severe obesity without serious comorbidity in adult [facility]) 01/27/2019    Past Surgical History:  Procedure Laterality Date   LAPAROSCOPIC TUBAL LIGATION Bilateral 01/11/2016   Procedure: LAPAROSCOPIC BILATERAL TUBAL LIGATION;  Surgeon: Christophe Louis, MD;  Location: [facility];  Service: Gynecology;  Laterality: Bilateral;     OB History     Gravida  3   Para  2   Term       Preterm      AB      Living  2      SAB      IAB      Ectopic      Multiple      Live Births              Family History  Problem Relation Age of Onset   Stroke Maternal Aunt    Breast cancer Sister    Hypertension Mother    Depression Mother    Anxiety disorder Mother    Sleep apnea Mother     Social History   Tobacco Use   Smoking status: Former    Pack years: 0.00   Smokeless tobacco: Never   Tobacco comments:    smoked for approx 2 years during teenhood  Substance Use Topics   Alcohol use: Yes    Alcohol/week: 2.0 standard drinks    Types: 2 Cans of beer per week   Drug use: No    Home Medications Prior to Admission medications   Medication Sig Start Date End Date Taking? Authorizing Provider  diclofenac Sodium (VOLTAREN) 1 % GEL Apply 2 g topically 4 (four) times daily. 03/26/21  Yes Rayna Sexton, PA-C  ACCU-CHEK GUIDE test strip USE UP  TO FOUR TIMES DAILY AS NEEDED 11/02/20   Minette Brine, FNP  Accu-Chek Softclix Lancets lancets USE UP TO FOUR TIMES DAILY AS DIRECTED 09/21/20   Glendale Chard, MD  albuterol (VENTOLIN HFA) 108 (90 Base) MCG/ACT inhaler INHALE 1 TO 2 PUFFS INTO THE LUNGS EVERY 6 HOURS AS NEEDED FOR WHEEZING OR SHORTNESS OF BREATH 09/14/19   Rodriguez-Southworth, Sunday Spillers, PA-C  blood glucose meter kit and supplies KIT Dispense based on patient and insurance preference. Use up to four times daily as directed. (FOR ICD-9 250.00, 250.01). 08/29/20   Minette Brine, FNP  diphenhydrAMINE (BENADRYL) 25 mg capsule Take 25 mg by mouth daily as needed for allergies.    [provider]  glucose blood (ACCU-CHEK GUIDE) test strip USE UP TO FOUR TIMES DAILY AS NEEDED 11/07/20   Minette Brine, FNP  metFORMIN (GLUCOPHAGE) 500 MG tablet TAKE 1 TABLET(500 MG) BY MOUTH DAILY WITH BREAKFAST 02/15/21   Abby Potash, PA-C  olopatadine (PATANOL) 0.1 % ophthalmic solution Place 1 drop into both eyes daily as needed for allergies.    [provider]  valACYclovir (VALTREX) 500 MG tablet TAKE 1 TABLET(500 MG) BY MOUTH TWICE DAILY 03/28/20   Minette Brine, FNP  Vitamin D, Ergocalciferol, (DRISDOL) 1.25 MG (50000 UNIT) CAPS capsule TAKE 1 CAPSULE BY MOUTH EVERY 7 DAYS 02/21/21   Abby Potash, PA-C    Allergies    Peanut-containing drug products  Review of Systems   Review of Systems  Constitutional:  Negative for fever.  Musculoskeletal:  Positive for arthralgias, joint swelling and myalgias.  Skin:  Negative for color change, rash and wound.  Neurological:  Negative for weakness and numbness.   Physical Exam Updated Vital Signs BP (!) 155/96   Pulse 78   Temp 98.8 F (37.1 C)   Resp 16   SpO2 98%   Physical Exam Vitals and nursing note reviewed.  Constitutional:      General: She is not in acute distress.    Appearance: Normal appearance. She is well-developed.  HENT:     Head: Normocephalic and atraumatic.     Right Ear: External ear normal.     Left Ear: External ear normal.  Eyes:     General: No scleral icterus.       Right eye: No discharge.        Left eye: No discharge.     Conjunctiva/sclera: Conjunctivae normal.  Neck:     Trachea: No tracheal deviation.  Cardiovascular:     Rate and Rhythm: Normal rate.  Pulmonary:     Effort: Pulmonary effort is normal. No respiratory distress.     Breath sounds: No stridor.  Abdominal:     General: There is no distension.  Musculoskeletal:        General: Tenderness present. No swelling or deformity.     Cervical back: Neck supple.     Comments: Mild TTP noted along the bilateral medial joint lines of the knees.  Difficult to assess due to body habitus but no obvious joint effusion was noted.  Negative anterior/posterior drawer.  No laxity noted with varus or valgus stress.  No overlying skin changes.  No increased warmth.  Full range of motion of the bilateral knees.  Distal sensation intact.  2+ DP pulses.  Skin:    General: Skin is warm and dry.      Findings: No rash.  Neurological:     General: No focal deficit present.     Mental Status: She is alert  and oriented to person, place, and time.     Cranial Nerves: Cranial nerve deficit: no gross deficits.  Psychiatric:        Mood and Affect: Mood normal.        Behavior: Behavior normal.   ED Results / Procedures / Treatments   Labs (all labs ordered are listed, but only abnormal results are displayed) Labs Reviewed - No data to display  EKG None  Radiology No results found.  Procedures Procedures   Medications Ordered in ED Medications - No data to display  ED Course  I have reviewed the triage vital signs and the nursing notes.  Pertinent labs & imaging results that were available during my care of the patient were reviewed by me and considered in my medical decision making (see chart for details).    MDM Rules/Calculators/A&P                          Patient is a 44 year old female who presents to the emergency department due to bilateral knee pain.  Pain started about 10 days ago after slipping.  Reports a history of pain in the knees but typically not of this severity.  Physical exam is extremely reassuring.  She is neurovascularly intact in the bilateral lower extremities.  Full range of motion of the knees.  No laxity appreciated in the knees.  No increased warmth or overlying skin changes.  No systemic symptoms.  Doubt septic joint at this time.  Did not feel that imaging was warranted at this time and patient was agreeable.  She has knee sleeves at home that she is going to continue wearing.  Recommended continued use of Tylenol/ibuprofen for management of her symptoms.  We discussed dosing.  We will provide a prescription for additional Voltaren gel.  She was also given referral to orthopedics if he finds that her symptoms are refractory.  We discussed return precautions.  Her questions were answered and she was amicable at the time of discharge.  Final  Clinical Impression(s) / ED Diagnoses Final diagnoses:  Acute pain of both knees   Rx / DC Orders ED Discharge Orders          Ordered    diclofenac Sodium (VOLTAREN) 1 % GEL  4 times daily        03/26/21 1420             Rayna Sexton, PA-C 03/26/21 1436    Quintella Reichert, MD 03/27/21 1544

## 2021-03-27 ENCOUNTER — Telehealth: Payer: Self-pay | Admitting: *Deleted

## 2021-03-27 NOTE — Telephone Encounter (Signed)
Transition Care Management Unsuccessful Follow-up Telephone Call  Date of discharge and from where:  03/26/2021 - [facility]  Attempts:  1st Attempt  Reason for unsuccessful TCM follow-up call:  Left voice message 

## 2021-03-28 NOTE — Telephone Encounter (Signed)
Transition Care Management Unsuccessful Follow-up Telephone Call  Date of discharge and from where:  03/26/2021 - [facility]  Attempts:  2nd Attempt  Reason for unsuccessful TCM follow-up call:  Left voice message 

## 2021-03-29 NOTE — Telephone Encounter (Signed)
Transition Care Management Unsuccessful Follow-up Telephone Call  Date of discharge and from where:  03/26/2021 [facility]  Attempts:  3rd Attempt  Reason for unsuccessful TCM follow-up call:  Left voice message

## 2021-04-05 DIAGNOSIS — F4323 Adjustment disorder with mixed anxiety and depressed mood: Secondary | ICD-10-CM | POA: Diagnosis not present

## 2021-04-19 DIAGNOSIS — F4323 Adjustment disorder with mixed anxiety and depressed mood: Secondary | ICD-10-CM | POA: Diagnosis not present

## 2021-04-23 ENCOUNTER — Other Ambulatory Visit (INDEPENDENT_AMBULATORY_CARE_PROVIDER_SITE_OTHER): Payer: Self-pay | Admitting: Physician Assistant

## 2021-04-23 DIAGNOSIS — E559 Vitamin D deficiency, unspecified: Secondary | ICD-10-CM

## 2021-04-25 ENCOUNTER — Other Ambulatory Visit (INDEPENDENT_AMBULATORY_CARE_PROVIDER_SITE_OTHER): Payer: Self-pay | Admitting: Family Medicine

## 2021-04-25 DIAGNOSIS — E8881 Metabolic syndrome: Secondary | ICD-10-CM

## 2021-05-03 DIAGNOSIS — F4323 Adjustment disorder with mixed anxiety and depressed mood: Secondary | ICD-10-CM | POA: Diagnosis not present

## 2021-05-23 ENCOUNTER — Encounter (HOSPITAL_COMMUNITY): Payer: Self-pay | Admitting: Emergency Medicine

## 2021-05-23 ENCOUNTER — Emergency Department (HOSPITAL_COMMUNITY): Payer: Medicaid Other

## 2021-05-23 ENCOUNTER — Other Ambulatory Visit: Payer: Self-pay

## 2021-05-23 ENCOUNTER — Emergency Department (HOSPITAL_COMMUNITY)
Admission: EM | Admit: 2021-05-23 | Discharge: 2021-05-23 | Disposition: A | Discharge status: Home / Self Care | Payer: Medicaid Other | Attending: Emergency Medicine | Admitting: Emergency Medicine

## 2021-05-23 DIAGNOSIS — R002 Palpitations: Secondary | ICD-10-CM | POA: Diagnosis not present

## 2021-05-23 DIAGNOSIS — R519 Headache, unspecified: Secondary | ICD-10-CM | POA: Insufficient documentation

## 2021-05-23 DIAGNOSIS — H538 Other visual disturbances: Secondary | ICD-10-CM | POA: Diagnosis not present

## 2021-05-23 DIAGNOSIS — Z91010 Allergy to peanuts: Secondary | ICD-10-CM | POA: Insufficient documentation

## 2021-05-23 DIAGNOSIS — Z87891 Personal history of nicotine dependence: Secondary | ICD-10-CM | POA: Diagnosis not present

## 2021-05-23 DIAGNOSIS — J45909 Unspecified asthma, uncomplicated: Secondary | ICD-10-CM | POA: Diagnosis not present

## 2021-05-23 LAB — I-STAT BETA HCG BLOOD, ED (MC, WL, AP ONLY): I-stat hCG, quantitative: <5 m[IU]/mL (ref <5)

## 2021-05-23 LAB — BASIC METABOLIC PANEL
Anion gap: 9 (ref 5–15)
BUN: 13 mg/dL (ref 6–20)
CO2: 24 mmol/L (ref 22–32)
Calcium: 8.8 mg/dL — ABNORMAL LOW (ref 8.9–10.3)
Chloride: 106 mmol/L (ref 98–111)
Creatinine, Ser: 0.62 mg/dL (ref 0.44–1.00)
GFR, Estimated: >60 mL/min (ref >60)
Glucose, Bld: 107 mg/dL — ABNORMAL HIGH (ref 70–99)
Potassium: 3.9 mmol/L (ref 3.5–5.1)
Sodium: 139 mmol/L (ref 135–145)

## 2021-05-23 LAB — CBC
HCT: 36.1 % (ref 36.0–46.0)
Hemoglobin: 11.5 g/dL — ABNORMAL LOW (ref 12.0–15.0)
MCH: 27.3 pg (ref 26.0–34.0)
MCHC: 31.9 g/dL (ref 30.0–36.0)
MCV: 85.5 fL (ref 80.0–100.0)
Platelets: 289 10*3/uL (ref 150–400)
RBC: 4.22 MIL/uL (ref 3.87–5.11)
RDW: 14.7 % (ref 11.5–15.5)
WBC: 6.4 10*3/uL (ref 4.0–10.5)
nRBC: 0 % (ref 0.0–0.2)

## 2021-05-23 MED ORDER — DIPHENHYDRAMINE HCL 50 MG/ML IJ SOLN
12.5000 mg | Freq: Once | INTRAMUSCULAR | Status: AC
  Administered 2021-05-23: 12.5 mg via INTRAVENOUS
  Filled 2021-05-23: qty 1

## 2021-05-23 MED ORDER — SODIUM CHLORIDE 0.9 % IV BOLUS
1000.0000 mL | Freq: Once | INTRAVENOUS | Status: AC
  Administered 2021-05-23: 1000 mL via INTRAVENOUS

## 2021-05-23 MED ORDER — METOCLOPRAMIDE HCL 5 MG/ML IJ SOLN
10.0000 mg | Freq: Once | INTRAMUSCULAR | Status: AC
  Administered 2021-05-23: 10 mg via INTRAVENOUS
  Filled 2021-05-23: qty 2

## 2021-05-23 MED ORDER — KETOROLAC TROMETHAMINE 30 MG/ML IJ SOLN
30.0000 mg | Freq: Once | INTRAMUSCULAR | Status: AC
  Administered 2021-05-23: 30 mg via INTRAVENOUS
  Filled 2021-05-23: qty 1

## 2021-05-23 NOTE — ED Provider Notes (Signed)
[facility] DEPT Provider Note   CSN: [id number] Arrival date & time: 05/23/21  1556     History Chief Complaint  Patient presents with   Palpitations   Headache    Tricia Curry is a 45 y.o. female.  She is here with 2 weeks of bitemporal headache, blurry vision, and heart palpitations.  She denies prior history of same.  She uses ibuprofen with intermittent relief.  Headache was gradual in onset.  No neck pain.  No numbness or weakness.  No infectious symptoms.  No trauma.  The history is provided by the patient.  Palpitations Palpitations quality:  Fast Onset quality:  Gradual Duration:  2 weeks Timing:  Sporadic Progression:  Unchanged Chronicity:  New Relieved by:  None tried Worsened by:  Nothing Ineffective treatments:  None tried Associated symptoms: no chest pain, no cough, no nausea, no shortness of breath and no vomiting   Headache Pain location:  L temporal and R temporal Quality:  Dull Radiates to:  Does not radiate Severity currently:  10/10 Severity at highest:  10/10 Onset quality:  Gradual Duration:  2 weeks Timing:  Constant Progression:  Unchanged Chronicity:  New Relieved by:  Nothing Worsened by:  Nothing Ineffective treatments:  NSAIDs Associated symptoms: no abdominal pain, no cough, no diarrhea, no fever, no focal weakness, no nausea, no neck pain, no neck stiffness, no sore throat and no vomiting       Past Medical History:  Diagnosis Date   Anemia    Anxiety    Asthma    Back pain    Constipation    Depression    Fatigue    Food allergy, peanut    GERD (gastroesophageal reflux disease)    Joint pain    Shortness of breath    Shortness of breath on exertion    Swelling of both lower extremities    Vitamin D deficiency     Patient Active Problem List   Diagnosis Date Noted   Insulin resistance 08/29/2020   Depression 04/14/2019   Iron deficiency anemia secondary to inadequate dietary iron  intake 01/27/2019   Palpitations 01/27/2019   Class 3 severe obesity without serious comorbidity in adult [facility]) 01/27/2019    Past Surgical History:  Procedure Laterality Date   LAPAROSCOPIC TUBAL LIGATION Bilateral 01/11/2016   Procedure: LAPAROSCOPIC BILATERAL TUBAL LIGATION;  Surgeon: Christophe Louis, MD;  Location: [facility];  Service: Gynecology;  Laterality: Bilateral;     OB History     Gravida  3   Para  2   Term      Preterm      AB      Living  2      SAB      IAB      Ectopic      Multiple      Live Births              Family History  Problem Relation Age of Onset   Stroke Maternal Aunt    Breast cancer Sister    Hypertension Mother    Depression Mother    Anxiety disorder Mother    Sleep apnea Mother     Social History   Tobacco Use   Smoking status: Former   Smokeless tobacco: Never   Tobacco comments:    smoked for approx 2 years during teenhood  Substance Use Topics   Alcohol use: Yes    Alcohol/week: 2.0 standard drinks    Types:  2 Cans of beer per week   Drug use: No    Home Medications Prior to Admission medications   Medication Sig Start Date End Date Taking? Authorizing Provider  ACCU-CHEK GUIDE test strip USE UP TO FOUR TIMES DAILY AS NEEDED 11/02/20   Minette Brine, FNP  Accu-Chek Softclix Lancets lancets USE UP TO FOUR TIMES DAILY AS DIRECTED 09/21/20   Glendale Chard, MD  albuterol (VENTOLIN HFA) 108 (90 Base) MCG/ACT inhaler INHALE 1 TO 2 PUFFS INTO THE LUNGS EVERY 6 HOURS AS NEEDED FOR WHEEZING OR SHORTNESS OF BREATH 09/14/19   Rodriguez-Southworth, Sunday Spillers, PA-C  blood glucose meter kit and supplies KIT Dispense based on patient and insurance preference. Use up to four times daily as directed. (FOR ICD-9 250.00, 250.01). 08/29/20   Minette Brine, FNP  diclofenac Sodium (VOLTAREN) 1 % GEL Apply 2 g topically 4 (four) times daily. 03/26/21   Rayna Sexton, PA-C  diphenhydrAMINE (BENADRYL) 25 mg capsule Take 25 mg by mouth daily  as needed for allergies.    [provider]  glucose blood (ACCU-CHEK GUIDE) test strip USE UP TO FOUR TIMES DAILY AS NEEDED 11/07/20   Minette Brine, FNP  metFORMIN (GLUCOPHAGE) 500 MG tablet TAKE 1 TABLET(500 MG) BY MOUTH DAILY WITH BREAKFAST 02/15/21   Abby Potash, PA-C  olopatadine (PATANOL) 0.1 % ophthalmic solution Place 1 drop into both eyes daily as needed for allergies.    [provider]  valACYclovir (VALTREX) 500 MG tablet TAKE 1 TABLET(500 MG) BY MOUTH TWICE DAILY 03/28/20   Minette Brine, FNP  Vitamin D, Ergocalciferol, (DRISDOL) 1.25 MG (50000 UNIT) CAPS capsule TAKE 1 CAPSULE BY MOUTH EVERY 7 DAYS 02/21/21   Abby Potash, PA-C    Allergies    Peanut-containing drug products  Review of Systems   Review of Systems  Constitutional:  Negative for fever.  HENT:  Negative for sore throat.   Eyes:  Positive for visual disturbance.  Respiratory:  Negative for cough and shortness of breath.   Cardiovascular:  Positive for palpitations. Negative for chest pain.  Gastrointestinal:  Negative for abdominal pain, diarrhea, nausea and vomiting.  Genitourinary:  Negative for dysuria.  Musculoskeletal:  Negative for neck pain and neck stiffness.  Skin:  Negative for rash.  Neurological:  Positive for headaches. Negative for focal weakness.   Physical Exam Updated Vital Signs BP (!) 148/100 (BP Location: Left Arm)   Pulse 76   Temp 98.4 F (36.9 C) (Oral)   Resp 18   SpO2 100%   Physical Exam Vitals and nursing note reviewed.  Constitutional:      General: She is not in acute distress.    Appearance: She is well-developed.  HENT:     Head: Normocephalic and atraumatic.  Eyes:     Conjunctiva/sclera: Conjunctivae normal.  Cardiovascular:     Rate and Rhythm: Normal rate and regular rhythm.     Heart sounds: Normal heart sounds. No murmur heard. Pulmonary:     Effort: Pulmonary effort is normal. No respiratory distress.     Breath sounds: Normal breath  sounds.  Abdominal:     Palpations: Abdomen is soft.     Tenderness: There is no abdominal tenderness.  Musculoskeletal:     Cervical back: Neck supple.  Skin:    General: Skin is warm and dry.  Neurological:     Mental Status: She is alert.     GCS: GCS eye subscore is 4. GCS verbal subscore is 5. GCS motor subscore is 6.  Cranial Nerves: No cranial nerve deficit, dysarthria or facial asymmetry.     Sensory: No sensory deficit.     Motor: No weakness.     Gait: Gait normal.    ED Results / Procedures / Treatments   Labs (all labs ordered are listed, but only abnormal results are displayed) Labs Reviewed  BASIC METABOLIC PANEL - Abnormal; Notable for the following components:      Result Value   Glucose, Bld 107 (*)    Calcium 8.8 (*)    All other components within normal limits  CBC - Abnormal; Notable for the following components:   Hemoglobin 11.5 (*)    All other components within normal limits  I-STAT BETA HCG BLOOD, ED ([facility], [facility], [facility] ONLY)    EKG EKG Interpretation  Date/Time:  Tuesday May 23 2021 16:28:57 EDT Ventricular Rate:  81 PR Interval:  176 QRS Duration: 70 QT Interval:  356 QTC Calculation: 413 R Axis:   -22 Text Interpretation: Normal sinus rhythm Low voltage QRS Inferior infarct , age undetermined Cannot rule out Anterior infarct , age undetermined Abnormal ECG since last tracing no significant change Confirmed by Daleen Bo [phone]) on 05/23/2021 5:08:19 PM  Radiology DG Chest 2 View  Result Date: 05/23/2021 CLINICAL DATA:  Palpitations EXAM: CHEST - 2 VIEW COMPARISON:  01/19/2019 FINDINGS: The heart size and mediastinal contours are within normal limits. Both lungs are clear. The visualized skeletal structures are unremarkable. IMPRESSION: No active cardiopulmonary disease. Electronically Signed   By: Rolm Baptise M.D.   On: 05/23/2021 17:35   CT HEAD WO CONTRAST (5MM)  Result Date: 05/23/2021 CLINICAL DATA:  Blurry vision.  Headache. EXAM: CT  HEAD WITHOUT CONTRAST TECHNIQUE: Contiguous axial images were obtained from the base of the skull through the vertex without intravenous contrast. COMPARISON:  No comparison studies available. FINDINGS: Brain: There is no evidence for acute hemorrhage, hydrocephalus, mass lesion, or abnormal extra-axial fluid collection. No definite CT evidence for acute infarction. Vascular: No hyperdense vessel or unexpected calcification. Skull: No evidence for fracture. No worrisome lytic or sclerotic lesion. Sinuses/Orbits: The visualized paranasal sinuses and mastoid air cells are clear. Visualized portions of the globes and intraorbital fat are unremarkable. Other: None. IMPRESSION: Unremarkable study.  No acute intracranial abnormality. Electronically Signed   By: Misty Stanley M.D.   On: 05/23/2021 17:32    Procedures Procedures   Medications Ordered in ED Medications  sodium chloride 0.9 % bolus 1,000 mL (has no administration in time range)  ketorolac (TORADOL) 30 MG/ML injection 30 mg (has no administration in time range)  metoCLOPramide (REGLAN) injection 10 mg (has no administration in time range)  diphenhydrAMINE (BENADRYL) injection 12.5 mg (has no administration in time range)    ED Course  I have reviewed the triage vital signs and the nursing notes.  Pertinent labs & imaging results that were available during my care of the patient were reviewed by me and considered in my medical decision making (see chart for details).  Clinical Course as of 05/24/21 1111  Tue May 23, 2021  2056 Patient states her headache is better after migraine cocktail.  She is rather drowsy and drove here so we will observe for period of time until more clear. [MB]    Clinical Course User Index [MB] Hayden Rasmussen, MD   MDM Rules/Calculators/A&P                          This patient complains  of bitemporal throbbing headache and palpitations; this involves an extensive number of treatment Options and is a  complaint that carries with it a high risk of complications and Morbidity. The differential includes generalized headache, migraine, tumor, tension headache, metabolic derangement, dehydration  I ordered, reviewed and interpreted labs, which included CBC with normal white count, stable hemoglobin, chemistries fairly unremarkable, pregnancy test negative I ordered medication IV fluids Reglan Toradol Benadryl with improvement in her symptoms I ordered imaging studies which included chest x-ray and head CT and I independently    visualized and interpreted imaging which showed no acute findings Previous records obtained and reviewed in epic no recent admissions  After the interventions stated above, I reevaluated the patient and found patient's headaches to be resolved.  She has had no arrhythmias on cardiac monitor.  Recommended close follow-up with PCP.  Return instructions discussed   Final Clinical Impression(s) / ED Diagnoses Final diagnoses:  Palpitations  Bad headache    Rx / DC Orders ED Discharge Orders     None        Hayden Rasmussen, MD 05/24/21 1113

## 2021-05-23 NOTE — ED Provider Notes (Signed)
Emergency Medicine Provider Triage Evaluation Note  Tricia Curry , a 45 y.o. female  was evaluated in triage.  Pt complains of HA with diplopia and blurry vision intermittently x 2 weeks. Palpitations. Improved with ibuprofen  Review of Systems  Positive: Diplopia, blurry visions, palpitations, HA Negative: CP, N/V/D, syncope  Physical Exam  BP 127/76 (BP Location: Left Arm)   Pulse 82   Temp 98.4 F (36.9 C) (Oral)   Resp 18   SpO2 99%  Gen:   Awake, no distress   Resp:  Normal effort  MSK:   Moves extremities without difficulty  Other:  RRR  no m/r/g  Medical Decision Making  Medically screening exam initiated at 4:46 PM.  Appropriate orders placed.  Tricia Curry was informed that the remainder of the evaluation will be completed by another provider, this initial triage assessment does not replace that evaluation, and the importance of remaining in the ED until their evaluation is complete.  This chart was dictated using voice recognition software, Dragon. Despite the best efforts of this provider to proofread and correct errors, errors may still occur which can change documentation meaning.    Paris Lore, PA-C 05/23/21 1648    Franne Forts, DO 05/23/21 1737

## 2021-05-23 NOTE — Discharge Instructions (Addendum)
Seen in the emergency room for evaluation of headache and palpitations.  You have a CAT scan of your head along with blood work that did not show any significant abnormalities.  Your symptoms improved with medication.  Please contact your primary care doctor for close follow-up.  Return to the emergency department if any worsening or concerning symptoms

## 2021-05-23 NOTE — ED Triage Notes (Signed)
Patient here from home reporting ongoing headache x2 weeks, palpitations "comes and goes". Blurred vision and eye pain. Denies n/v/d.

## 2021-05-24 ENCOUNTER — Encounter: Payer: Self-pay | Admitting: Nurse Practitioner

## 2021-05-24 ENCOUNTER — Telehealth: Payer: Self-pay

## 2021-05-24 NOTE — Telephone Encounter (Signed)
Transition Care Management Follow-up Telephone Call Date of discharge and from where: 05/23/2021-[facility] How have you been since you were released from the hospital? Patient stated she is doing much better.  Any questions or concerns? No  Items Reviewed: Did the pt receive and understand the discharge instructions provided? Yes  Medications obtained and verified?  No medications given at discharge Other? No  Any new allergies since your discharge? No  Dietary orders reviewed? N/A Do you have support at home? Yes   Home Care and Equipment/Supplies: Were home health services ordered? not applicable If so, what is the name of the agency? N/A  Has the agency set up a time to come to the patient's home? not applicable Were any new equipment or medical supplies ordered?  No What is the name of the medical supply agency? N/A Were you able to get the supplies/equipment? not applicable Do you have any questions related to the use of the equipment or supplies? No  Functional Questionnaire: (I = Independent and D = Dependent) ADLs: I  Bathing/Dressing- I  Meal Prep- I  Eating- I  Maintaining continence- I  Transferring/Ambulation- I  Managing Meds- I  Follow up appointments reviewed:  PCP Hospital f/u appt confirmed? Yes  Scheduled to see Justice Rocher on 05/25/2021 @ 2:00 PM. Specialist Hospital f/u appt confirmed? No   Are transportation arrangements needed? No  If their condition worsens, is the pt aware to call PCP or go to the Emergency Dept.? Yes Was the patient provided with contact information for the PCP's office or ED? Yes Was to pt encouraged to call back with questions or concerns? Yes

## 2021-05-25 ENCOUNTER — Other Ambulatory Visit: Payer: Self-pay

## 2021-05-25 ENCOUNTER — Ambulatory Visit: Payer: Medicaid Other | Admitting: Nurse Practitioner

## 2021-05-25 ENCOUNTER — Encounter: Payer: Self-pay | Admitting: Nurse Practitioner

## 2021-05-25 VITALS — BP 126/82 | HR 97 | Temp 98.7°F | Ht 66.0 in | Wt 258.8 lb

## 2021-05-25 DIAGNOSIS — G43009 Migraine without aura, not intractable, without status migrainosus: Secondary | ICD-10-CM | POA: Diagnosis not present

## 2021-05-25 DIAGNOSIS — F419 Anxiety disorder, unspecified: Secondary | ICD-10-CM | POA: Diagnosis not present

## 2021-05-25 DIAGNOSIS — Z6841 Body Mass Index (BMI) 40.0 and over, adult: Secondary | ICD-10-CM | POA: Diagnosis not present

## 2021-05-25 DIAGNOSIS — F32A Depression, unspecified: Secondary | ICD-10-CM

## 2021-05-25 MED ORDER — ESCITALOPRAM OXALATE 10 MG PO TABS: 10.0000 mg | ORAL_TABLET | Freq: Every day | ORAL | 2 refills | Status: DC

## 2021-05-25 NOTE — Progress Notes (Signed)
I,Yamilka Roman [employer] as a Education administrator for [employer], NP.,have documented all relevant documentation on the behalf of [employer], NP,as directed by  Bary Castilla, NP while in the presence of Bary Castilla, NP.  This visit occurred during the SARS-CoV-2 public health emergency.  Safety protocols were in place, including screening questions prior to the visit, additional usage of staff PPE, and extensive cleaning of exam room while observing appropriate contact time as indicated for disinfecting solutions.  Subjective:     Patient ID: Tricia Curry , female    DOB: 12-22-1975 , 45 y.o.   MRN: [id number]  Chief Complaint  Patient presents with   ER F/U   HPI  Patient presents today for a ER F/U.  Patient stated she went to the ER because she was having palpitations, headache and blurred vision. Her symptoms have improved except her headache has come back.  Today in office her Bp was controlled at 126/82. She stated she does have some anxiety and depression. She scored 8 on PHQ-9 depression screening and she scored 11 on the GAD 7 anxiety screening. She does state that she gets weekly therapy but may also benefit from medication as she tries to manage with stress with work and at home. She is willing to try medication and is here to talk about it along with other therapeutic methods.     Past Medical History:  Diagnosis Date   Anemia    Anxiety    Asthma    Back pain    Constipation    Depression    Fatigue    Food allergy, peanut    GERD (gastroesophageal reflux disease)    Joint pain    Shortness of breath    Shortness of breath on exertion    Swelling of both lower extremities    Vitamin D deficiency      Family History  Problem Relation Age of Onset   Stroke Maternal Aunt    Breast cancer Sister    Hypertension Mother    Depression Mother    Anxiety disorder Mother    Sleep apnea Mother      Current Outpatient Medications:    ACCU-CHEK GUIDE  test strip, USE UP TO FOUR TIMES DAILY AS NEEDED, Disp: 100 strip, Rfl: 2   Accu-Chek Softclix Lancets lancets, USE UP TO FOUR TIMES DAILY AS DIRECTED, Disp: 100 each, Rfl: 2   albuterol (VENTOLIN HFA) 108 (90 Base) MCG/ACT inhaler, INHALE 1 TO 2 PUFFS INTO THE LUNGS EVERY 6 HOURS AS NEEDED FOR WHEEZING OR SHORTNESS OF BREATH, Disp: 18 g, Rfl: 2   blood glucose meter kit and supplies KIT, Dispense based on patient and insurance preference. Use up to four times daily as directed. (FOR ICD-9 250.00, 250.01)., Disp: 1 each, Rfl: 0   diclofenac Sodium (VOLTAREN) 1 % GEL, Apply 2 g topically 4 (four) times daily., Disp: 50 g, Rfl: 1   diphenhydrAMINE (BENADRYL) 25 mg capsule, Take 25 mg by mouth daily as needed for allergies., Disp: , Rfl:    glucose blood (ACCU-CHEK GUIDE) test strip, USE UP TO FOUR TIMES DAILY AS NEEDED, Disp: 100 strip, Rfl: 2   metFORMIN (GLUCOPHAGE) 500 MG tablet, TAKE 1 TABLET(500 MG) BY MOUTH DAILY WITH BREAKFAST, Disp: 30 tablet, Rfl: 0   olopatadine (PATANOL) 0.1 % ophthalmic solution, Place 1 drop into both eyes daily as needed for allergies., Disp: , Rfl:    valACYclovir (VALTREX) 500 MG tablet, TAKE 1 TABLET(500 MG) BY MOUTH TWICE DAILY, Disp:  60 tablet, Rfl: 3   Vitamin D, Ergocalciferol, (DRISDOL) 1.25 MG (50000 UNIT) CAPS capsule, TAKE 1 CAPSULE BY MOUTH EVERY 7 DAYS, Disp: 4 capsule, Rfl: 0   Allergies  Allergen Reactions   Peanut-Containing Drug Products Anaphylaxis     Review of Systems  Constitutional: Negative.  Negative for chills and fever.  HENT:  Negative for congestion, sinus pressure and sinus pain.   Eyes:  Negative for visual disturbance.  Respiratory:  Negative for shortness of breath and wheezing.   Cardiovascular:  Negative for chest pain and palpitations.  Gastrointestinal:  Negative for constipation, diarrhea and nausea.  Musculoskeletal: Negative.  Negative for arthralgias and myalgias.  Skin: Negative.   Neurological:  Positive for headaches.   Psychiatric/Behavioral:  Positive for sleep disturbance.     Today's Vitals   05/25/21 1419  BP: 126/82  Pulse: 97  Temp: 98.7 F (37.1 C)  Weight: 258 lb 12.8 oz (117.4 kg)  Height: '5\' 6"'  (1.676 m)  PainSc: 3   PainLoc: Head   Body mass index is 41.77 kg/m.   Objective:  Physical Exam Constitutional:      Appearance: Normal appearance. She is obese.  HENT:     Head: Normocephalic and atraumatic.  Cardiovascular:     Rate and Rhythm: Normal rate and regular rhythm.     Pulses: Normal pulses.     Heart sounds: Normal heart sounds. No murmur heard. Pulmonary:     Effort: Pulmonary effort is normal. No respiratory distress.     Breath sounds: Normal breath sounds. No wheezing.  Skin:    General: Skin is warm and dry.     Capillary Refill: Capillary refill takes less than 2 seconds.  Neurological:     Mental Status: She is alert and oriented to person, place, and time.        Assessment And Plan:     1. Anxiety and depression -She gets weekly therapy with counseling -I have suggested patient use support to help her with work and home balance  -Patient will make initiative to engage more in personal care.  -Patient willing to try medication to help manage her stress, anxiety and depression.  -PHQ-9 score was 8 and GAD 7 score was 11  - escitalopram (LEXAPRO) 10 MG tablet; Take 1 tablet (10 mg total) by mouth daily.  Dispense: 30 tablet; Refill: 2   2. Migraine without aura and without status migrainosus, not intractable -Patient will try to manage her HA with stress relieving techniques -Pt. Advised about available medications. For her HA and their SE.   -Pt advised about taking OTC pain relievers as need  -Pt. Educated on red flags and sign of a thunderclap HA    3. Class 3 severe obesity due to excess calories without serious comorbidity with body mass index (BMI) of 40.0 to 44.9 in adult [facility])  Advised patient on a healthy diet including avoiding fast food and  red meats. Increase the intake of lean meats including grilled chicken and [place].  Drink a lot of water. Decrease intake of fatty foods. Exercise for 30-45 min. 4-5 a week to decrease the risk of cardiac event.   The patient was encouraged to call or send a message through [place] for any questions or concerns.   Follow up: if symptoms persist or do not get better.   Patient was given opportunity to ask questions. Patient verbalized understanding of the plan and was able to repeat key elements of the plan. All questions were  answered to their satisfaction.  Raman Treavon Castilleja, DNP   I, Raman Dakoda Bassette have reviewed all documentation for this visit. The documentation on 05/26/21 for the exam, diagnosis, procedures, and orders are all accurate and complete.     IF YOU HAVE BEEN REFERRED TO A SPECIALIST, IT MAY TAKE 1-2 WEEKS TO SCHEDULE/PROCESS THE REFERRAL. IF YOU HAVE NOT HEARD FROM US/SPECIALIST IN TWO WEEKS, PLEASE GIVE [name] A CALL AT [phone].   THE PATIENT IS ENCOURAGED TO PRACTICE SOCIAL DISTANCING DUE TO THE COVID-19 PANDEMIC.

## 2021-07-02 NOTE — Patient Instructions (Signed)
Health Maintenance, Female Adopting a healthy lifestyle and getting preventive care are important in promoting health and wellness. Ask your health care provider about: The right schedule for you to have regular tests and exams. Things you can do on your own to prevent diseases and keep yourself healthy. What should I know about diet, weight, and exercise? Eat a healthy diet  Eat a diet that includes plenty of vegetables, fruits, low-fat dairy products, and lean protein. Do not eat a lot of foods that are high in solid fats, added sugars, or sodium. Maintain a healthy weight Body mass index (BMI) is used to identify weight problems. It estimates body fat based on height and weight. Your health care provider can help determine your BMI and help you achieve or maintain a healthy weight. Get regular exercise Get regular exercise. This is one of the most important things you can do for your health. Most adults should: Exercise for at least 150 minutes each week. The exercise should increase your heart rate and make you sweat (moderate-intensity exercise). Do strengthening exercises at least twice a week. This is in addition to the moderate-intensity exercise. Spend less time sitting. Even light physical activity can be beneficial. Watch cholesterol and blood lipids Have your blood tested for lipids and cholesterol at 44 years of age, then have this test every 5 years. Have your cholesterol levels checked more often if: Your lipid or cholesterol levels are high. You are older than 44 years of age. You are at high risk for heart disease. What should I know about cancer screening? Depending on your health history and family history, you may need to have cancer screening at various ages. This may include screening for: Breast cancer. Cervical cancer. Colorectal cancer. Skin cancer. Lung cancer. What should I know about heart disease, diabetes, and high blood pressure? Blood pressure and heart  disease High blood pressure causes heart disease and increases the risk of stroke. This is more likely to develop in people who have high blood pressure readings, are of African descent, or are overweight. Have your blood pressure checked: Every 3-5 years if you are 18-39 years of age. Every year if you are 40 years old or older. Diabetes Have regular diabetes screenings. This checks your fasting blood sugar level. Have the screening done: Once every three years after age 40 if you are at a normal weight and have a low risk for diabetes. More often and at a younger age if you are overweight or have a high risk for diabetes. What should I know about preventing infection? Hepatitis B If you have a higher risk for hepatitis B, you should be screened for this virus. Talk with your health care provider to find out if you are at risk for hepatitis B infection. Hepatitis C Testing is recommended for: Everyone born from 1945 through 1965. Anyone with known risk factors for hepatitis C. Sexually transmitted infections (STIs) Get screened for STIs, including gonorrhea and chlamydia, if: You are sexually active and are younger than 44 years of age. You are older than 44 years of age and your health care provider tells you that you are at risk for this type of infection. Your sexual activity has changed since you were last screened, and you are at increased risk for chlamydia or gonorrhea. Ask your health care provider if you are at risk. Ask your health care provider about whether you are at high risk for HIV. Your health care provider may recommend a prescription medicine   to help prevent HIV infection. If you choose to take medicine to prevent HIV, you should first get tested for HIV. You should then be tested every 3 months for as long as you are taking the medicine. Pregnancy If you are about to stop having your period (premenopausal) and you may become pregnant, seek counseling before you get  pregnant. Take 400 to 800 micrograms (mcg) of folic acid every day if you become pregnant. Ask for birth control (contraception) if you want to prevent pregnancy. Osteoporosis and menopause Osteoporosis is a disease in which the bones lose minerals and strength with aging. This can result in bone fractures. If you are 65 years old or older, or if you are at risk for osteoporosis and fractures, ask your health care provider if you should: Be screened for bone loss. Take a calcium or vitamin D supplement to lower your risk of fractures. Be given hormone replacement therapy (HRT) to treat symptoms of menopause. Follow these instructions at home: Lifestyle Do not use any products that contain nicotine or tobacco, such as cigarettes, e-cigarettes, and chewing tobacco. If you need help quitting, ask your health care provider. Do not use street drugs. Do not share needles. Ask your health care provider for help if you need support or information about quitting drugs. Alcohol use Do not drink alcohol if: Your health care provider tells you not to drink. You are pregnant, may be pregnant, or are planning to become pregnant. If you drink alcohol: Limit how much you use to 0-1 drink a day. Limit intake if you are breastfeeding. Be aware of how much alcohol is in your drink. In the [place] one drink equals one 12 oz bottle of beer (355 mL), one 5 oz glass of wine (148 mL), or one 1 oz glass of hard liquor (44 mL). General instructions Schedule regular health, dental, and eye exams. Stay current with your vaccines. Tell your health care provider if: You often feel depressed. You have ever been abused or do not feel safe at home. Summary Adopting a healthy lifestyle and getting preventive care are important in promoting health and wellness. Follow your health care provider's instructions about healthy diet, exercising, and getting tested or screened for diseases. Follow your health care provider's  instructions on monitoring your cholesterol and blood pressure. This information is not intended to replace advice given to you by your health care provider. Make sure you discuss any questions you have with your health care provider. Document Revised: 12/09/2020 Document Reviewed: 09/24/2018 Elsevier Patient Education  2022 Elsevier Inc.  

## 2021-07-02 NOTE — Progress Notes (Signed)
I,Tianna Badgett,acting as a Education administrator for [employer], FNP.,have documented all relevant documentation on the behalf of Minette Brine, FNP,as directed by  Minette Brine, FNP while in the presence of Minette Brine, [place].  This visit occurred during the SARS-CoV-2 public health emergency.  Safety protocols were in place, including screening questions prior to the visit, additional usage of staff PPE, and extensive cleaning of exam room while observing appropriate contact time as indicated for disinfecting solutions.  Subjective:     Patient ID: Tricia Curry , female    DOB: [birth year] , 45 y.o.   MRN: [id number]   Chief Complaint  Patient presents with   Annual Exam    HPI  She is here for HM.    She is taking escitalopram which she feels is making her not want to do anything, she does feel like her anxiety is getting better. Also her blood pressure has improved since taking the medication. She continues to go to counseling.   Wt Readings from Last 3 Encounters: 07/03/21 : 252 lb (114.3 kg) 05/25/21 : 258 lb 12.8 oz (117.4 kg) 02/15/21 : 259 lb (117.5 kg)     Past Medical History:  Diagnosis Date   Anemia    Anxiety    Asthma    Back pain    Constipation    Depression    Fatigue    Food allergy, peanut    GERD (gastroesophageal reflux disease)    Joint pain    Shortness of breath    Shortness of breath on exertion    Swelling of both lower extremities    Vitamin D deficiency      Family History  Problem Relation Age of Onset   Stroke Maternal Aunt    Breast cancer Sister    Hypertension Mother    Depression Mother    Anxiety disorder Mother    Sleep apnea Mother      Current Outpatient Medications:    ACCU-CHEK GUIDE test strip, USE UP TO FOUR TIMES DAILY AS NEEDED, Disp: 100 strip, Rfl: 2   Accu-Chek Softclix Lancets lancets, USE UP TO FOUR TIMES DAILY AS DIRECTED, Disp: 100 each, Rfl: 2   albuterol (VENTOLIN HFA) 108 (90 Base) MCG/ACT inhaler, INHALE 1 TO 2  PUFFS INTO THE LUNGS EVERY 6 HOURS AS NEEDED FOR WHEEZING OR SHORTNESS OF BREATH, Disp: 18 g, Rfl: 2   blood glucose meter kit and supplies KIT, Dispense based on patient and insurance preference. Use up to four times daily as directed. (FOR ICD-9 250.00, 250.01)., Disp: 1 each, Rfl: 0   diclofenac Sodium (VOLTAREN) 1 % GEL, Apply 2 g topically 4 (four) times daily., Disp: 50 g, Rfl: 1   diphenhydrAMINE (BENADRYL) 25 mg capsule, Take 25 mg by mouth daily as needed for allergies., Disp: , Rfl:    escitalopram (LEXAPRO) 10 MG tablet, Take 1 tablet (10 mg total) by mouth daily., Disp: 30 tablet, Rfl: 2   glucose blood (ACCU-CHEK GUIDE) test strip, USE UP TO FOUR TIMES DAILY AS NEEDED, Disp: 100 strip, Rfl: 2   olopatadine (PATANOL) 0.1 % ophthalmic solution, Place 1 drop into both eyes daily as needed for allergies., Disp: , Rfl:    valACYclovir (VALTREX) 500 MG tablet, TAKE 1 TABLET(500 MG) BY MOUTH TWICE DAILY, Disp: 60 tablet, Rfl: 3   Vitamin D, Ergocalciferol, (DRISDOL) 1.25 MG (50000 UNIT) CAPS capsule, TAKE 1 CAPSULE BY MOUTH EVERY 7 DAYS, Disp: 4 capsule, Rfl: 0   Allergies  Allergen Reactions  Peanut-Containing Drug Products Anaphylaxis      The patient states she uses none for birth control. Last LMP 07/01/2021.  Patient's last menstrual period was 07/01/2021..  Negative for: breast discharge, breast lump(s), breast pain and breast self exam. Associated symptoms include abnormal vaginal bleeding. Pertinent negatives include abnormal bleeding (hematology), anxiety, decreased libido, depression, difficulty falling sleep, dyspareunia, history of infertility, nocturia, sexual dysfunction, sleep disturbances, urinary incontinence, urinary urgency, vaginal discharge and vaginal itching. Diet regular; she has to make herself eat due to stress and anxiety. She has gotten rid of all her snacks in the house. She is also keeping alcohol and snacks out of the house because she uses as a"crutch". The  patient states her exercise level is none.   The patient's tobacco use is:  Social History   Tobacco Use  Smoking Status Former  Smokeless Tobacco Never  Tobacco Comments   smoked for approx 2 years during teenhood   She has been exposed to passive smoke. The patient's alcohol use is:  Social History   Substance and Sexual Activity  Alcohol Use Yes   Alcohol/week: 2.0 standard drinks   Types: 2 Cans of beer per week   Additional information: Last pap 04/14/2019, next one scheduled for 04/13/2022.    Review of Systems  Constitutional: Negative.   HENT: Negative.    Eyes: Negative.   Respiratory: Negative.    Cardiovascular: Negative.   Gastrointestinal: Negative.   Endocrine: Negative.   Genitourinary: Negative.   Musculoskeletal: Negative.   Skin: Negative.   Allergic/Immunologic: Negative.   Neurological: Negative.   Hematological: Negative.   Psychiatric/Behavioral: Negative.      Today's Vitals   07/03/21 0911  BP: 120/74  Pulse: 68  Temp: 98.1 F (36.7 C)  TempSrc: Oral  Weight: 252 lb (114.3 kg)  Height: '5\' 6"'  (1.676 m)   Body mass index is 40.67 kg/m.   Objective:  Physical Exam Vitals reviewed.  Constitutional:      General: She is not in acute distress.    Appearance: Normal appearance. She is well-developed. She is obese.  HENT:     Head: Normocephalic and atraumatic.     Right Ear: Hearing, tympanic membrane, ear canal and external ear normal. There is no impacted cerumen.     Left Ear: Hearing, tympanic membrane, ear canal and external ear normal. There is no impacted cerumen.     Nose:     Comments: Deferred - masked    Mouth/Throat:     Comments: Deferred - masked Eyes:     General: Lids are normal.     Extraocular Movements: Extraocular movements intact.     Conjunctiva/sclera: Conjunctivae normal.     Pupils: Pupils are equal, round, and reactive to light.     Funduscopic exam:    Right eye: No papilledema.        Left eye: No  papilledema.  Neck:     Thyroid: No thyroid mass.     Vascular: No carotid bruit.  Cardiovascular:     Rate and Rhythm: Normal rate and regular rhythm.     Pulses: Normal pulses.     Heart sounds: Normal heart sounds. No murmur heard. Pulmonary:     Effort: Pulmonary effort is normal.     Breath sounds: Normal breath sounds.  Abdominal:     General: Abdomen is flat. Bowel sounds are normal. There is no distension.     Palpations: Abdomen is soft.     Tenderness: There is no  abdominal tenderness.  Genitourinary:    Rectum: Guaiac result negative.  Musculoskeletal:        General: No swelling or tenderness. Normal range of motion.     Cervical back: Full passive range of motion without pain, normal range of motion and neck supple.     Right lower leg: No edema.     Left lower leg: No edema.  Skin:    General: Skin is warm and dry.     Capillary Refill: Capillary refill takes less than 2 seconds.  Neurological:     General: No focal deficit present.     Mental Status: She is alert and oriented to person, place, and time.     Cranial Nerves: No cranial nerve deficit.     Sensory: No sensory deficit.  Psychiatric:        Mood and Affect: Mood normal.        Behavior: Behavior normal.        Thought Content: Thought content normal.        Judgment: Judgment normal.        Assessment And Plan:     1. Encounter for general adult medical examination w/o abnormal findings Behavior modifications discussed and diet history reviewed.   Pt will continue to exercise regularly and modify diet with low GI, plant based foods and decrease intake of processed foods.  Recommend intake of daily multivitamin, Vitamin D, and calcium.  Recommend mammogram for preventive screenings, as well as recommend immunizations that include influenza, TDAP (up to date) - CBC  2. Class 3 severe obesity due to excess calories without serious comorbidity with body mass index (BMI) of 40.0 to 44.9 in adult  [facility]) Chronic Discussed healthy diet and regular exercise options  Encouraged to increase physical activity by just walking  3. Screening mammogram, encounter for Pt instructed on Self Breast Exam.According to ACOG guidelines Women aged 31 and older are recommended to get an annual mammogram. Form completed and given to patient contact the [facility] for appointment scheduing.  Pt encouraged to get annual mammogram - MM Digital Screening; Future  4. Need for influenza vaccination Influenza vaccine administered Encouraged to take Tylenol as needed for fever or muscle aches. - Flu Vaccine QUAD 6+ mos PF IM (Fluarix Quad PF)  5. Other insomnia She is doing better with her escitalopram  6. Elevated cholesterol Chronic, stable Continue with eating healthy diet low in fat - CMP14+EGFR - Lipid panel  7. Insulin resistance Encouraged to continue avoiding high carb foods and sugar - Hemoglobin A1c  8. Vitamin D deficiency Will check vitamin D level and supplement as needed.    Also encouraged to spend 15 minutes in the sun daily.  - VITAMIN D 25 Hydroxy (Vit-D Deficiency, Fractures)  9. Anxiety and depression Continue escitalopram and follow up in February    Patient was given opportunity to ask questions. Patient verbalized understanding of the plan and was able to repeat key elements of the plan. All questions were answered to their satisfaction.   Minette Brine, FNP   I, Minette Brine, FNP, have reviewed all documentation for this visit. The documentation on 07/03/21 for the exam, diagnosis, procedures, and orders are all accurate and complete.  THE PATIENT IS ENCOURAGED TO PRACTICE SOCIAL DISTANCING DUE TO THE COVID-19 PANDEMIC.

## 2021-07-03 ENCOUNTER — Other Ambulatory Visit: Payer: Self-pay

## 2021-07-03 ENCOUNTER — Encounter: Payer: Self-pay | Admitting: Nurse Practitioner

## 2021-07-03 ENCOUNTER — Ambulatory Visit (INDEPENDENT_AMBULATORY_CARE_PROVIDER_SITE_OTHER): Payer: Medicaid Other | Admitting: Nurse Practitioner

## 2021-07-03 VITALS — BP 120/74 | HR 68 | Temp 98.1°F | Ht 66.0 in | Wt 252.0 lb

## 2021-07-03 DIAGNOSIS — Z1231 Encounter for screening mammogram for malignant neoplasm of breast: Secondary | ICD-10-CM

## 2021-07-03 DIAGNOSIS — Z6841 Body Mass Index (BMI) 40.0 and over, adult: Secondary | ICD-10-CM

## 2021-07-03 DIAGNOSIS — E8881 Metabolic syndrome: Secondary | ICD-10-CM

## 2021-07-03 DIAGNOSIS — E88819 Insulin resistance, unspecified: Secondary | ICD-10-CM

## 2021-07-03 DIAGNOSIS — F32A Depression, unspecified: Secondary | ICD-10-CM

## 2021-07-03 DIAGNOSIS — G4709 Other insomnia: Secondary | ICD-10-CM

## 2021-07-03 DIAGNOSIS — F419 Anxiety disorder, unspecified: Secondary | ICD-10-CM

## 2021-07-03 DIAGNOSIS — Z0000 Encounter for general adult medical examination without abnormal findings: Secondary | ICD-10-CM | POA: Diagnosis not present

## 2021-07-03 DIAGNOSIS — Z23 Encounter for immunization: Secondary | ICD-10-CM

## 2021-07-03 DIAGNOSIS — E559 Vitamin D deficiency, unspecified: Secondary | ICD-10-CM | POA: Diagnosis not present

## 2021-07-03 DIAGNOSIS — E7800 Pure hypercholesterolemia, unspecified: Secondary | ICD-10-CM

## 2021-07-03 DIAGNOSIS — E66813 Obesity, class 3: Secondary | ICD-10-CM

## 2021-07-03 DIAGNOSIS — R5383 Other fatigue: Secondary | ICD-10-CM | POA: Diagnosis not present

## 2021-07-04 LAB — CMP14+EGFR
ALT: 9 IU/L (ref 0–32)
AST: 13 IU/L (ref 0–40)
Albumin/Globulin Ratio: 1.1 — ABNORMAL LOW (ref 1.2–2.2)
Albumin: 4 g/dL (ref 3.8–4.8)
Alkaline Phosphatase: 67 IU/L (ref 44–121)
BUN/Creatinine Ratio: 16 (ref 9–23)
BUN: 12 mg/dL (ref 6–24)
Bilirubin Total: 0.4 mg/dL (ref 0.0–1.2)
CO2: 23 mmol/L (ref 20–29)
Calcium: 8.9 mg/dL (ref 8.7–10.2)
Chloride: 103 mmol/L (ref 96–106)
Creatinine, Ser: 0.74 mg/dL (ref 0.57–1.00)
Globulin, Total: 3.7 g/dL (ref 1.5–4.5)
Glucose: 79 mg/dL (ref 65–99)
Potassium: 4.4 mmol/L (ref 3.5–5.2)
Sodium: 139 mmol/L (ref 134–144)
Total Protein: 7.7 g/dL (ref 6.0–8.5)
eGFR: 102 mL/min/{1.73_m2} (ref >59)

## 2021-07-04 LAB — CBC
Hematocrit: 38.2 % (ref 34.0–46.6)
Hemoglobin: 11.9 g/dL (ref 11.1–15.9)
MCH: 26.3 pg — ABNORMAL LOW (ref 26.6–33.0)
MCHC: 31.2 g/dL — ABNORMAL LOW (ref 31.5–35.7)
MCV: 85 fL (ref 79–97)
Platelets: 291 10*3/uL (ref 150–450)
RBC: 4.52 x10E6/uL (ref 3.77–5.28)
RDW: 14 % (ref 11.7–15.4)
WBC: 4.7 10*3/uL (ref 3.4–10.8)

## 2021-07-04 LAB — HEMOGLOBIN A1C
Est. average glucose Bld gHb Est-mCnc: 77 mg/dL
Hgb A1c MFr Bld: 4.3 % — ABNORMAL LOW (ref 4.8–5.6)

## 2021-07-04 LAB — LIPID PANEL
Chol/HDL Ratio: 3.1 ratio (ref 0.0–4.4)
Cholesterol, Total: 170 mg/dL (ref 100–199)
HDL: 55 mg/dL (ref >39)
LDL Chol Calc (NIH): 104 mg/dL — ABNORMAL HIGH (ref 0–99)
Triglycerides: 58 mg/dL (ref 0–149)
VLDL Cholesterol Cal: 11 mg/dL (ref 5–40)

## 2021-07-04 LAB — VITAMIN D 25 HYDROXY (VIT D DEFICIENCY, FRACTURES): Vit D, 25-Hydroxy: 20.6 ng/mL — ABNORMAL LOW (ref 30.0–100.0)

## 2021-07-12 DIAGNOSIS — F4323 Adjustment disorder with mixed anxiety and depressed mood: Secondary | ICD-10-CM | POA: Diagnosis not present

## 2021-07-27 DIAGNOSIS — F4323 Adjustment disorder with mixed anxiety and depressed mood: Secondary | ICD-10-CM | POA: Diagnosis not present

## 2021-08-18 ENCOUNTER — Encounter: Payer: Self-pay | Admitting: Nurse Practitioner

## 2021-09-04 ENCOUNTER — Ambulatory Visit
Admission: RE | Admit: 2021-09-04 | Discharge: 2021-09-04 | Disposition: A | Discharge status: Home / Self Care | Payer: Medicaid Other | Source: Ambulatory Visit | Attending: Nurse Practitioner | Admitting: Nurse Practitioner

## 2021-09-04 DIAGNOSIS — Z1231 Encounter for screening mammogram for malignant neoplasm of breast: Secondary | ICD-10-CM

## 2021-11-01 ENCOUNTER — Encounter: Payer: Self-pay | Admitting: Nurse Practitioner

## 2021-11-15 DIAGNOSIS — F4323 Adjustment disorder with mixed anxiety and depressed mood: Secondary | ICD-10-CM | POA: Diagnosis not present

## 2021-11-22 DIAGNOSIS — F4323 Adjustment disorder with mixed anxiety and depressed mood: Secondary | ICD-10-CM | POA: Diagnosis not present

## 2021-11-29 DIAGNOSIS — F4323 Adjustment disorder with mixed anxiety and depressed mood: Secondary | ICD-10-CM | POA: Diagnosis not present

## 2021-12-06 DIAGNOSIS — F4323 Adjustment disorder with mixed anxiety and depressed mood: Secondary | ICD-10-CM | POA: Diagnosis not present

## 2021-12-08 ENCOUNTER — Encounter: Payer: Self-pay | Admitting: Nurse Practitioner

## 2021-12-13 DIAGNOSIS — F4323 Adjustment disorder with mixed anxiety and depressed mood: Secondary | ICD-10-CM | POA: Diagnosis not present

## 2021-12-18 ENCOUNTER — Encounter: Payer: Self-pay | Admitting: Nurse Practitioner

## 2021-12-27 DIAGNOSIS — F4323 Adjustment disorder with mixed anxiety and depressed mood: Secondary | ICD-10-CM | POA: Diagnosis not present

## 2022-01-01 ENCOUNTER — Encounter: Payer: Self-pay | Admitting: Nurse Practitioner

## 2022-01-01 ENCOUNTER — Ambulatory Visit: Payer: Medicaid Other | Admitting: Nurse Practitioner

## 2022-01-01 ENCOUNTER — Other Ambulatory Visit: Payer: Self-pay

## 2022-01-01 ENCOUNTER — Ambulatory Visit: Payer: Medicaid Other | Admitting: Internal Medicine

## 2022-01-01 VITALS — BP 114/80 | HR 78 | Temp 99.0°F | Ht 66.0 in | Wt 263.0 lb

## 2022-01-01 DIAGNOSIS — F32A Depression, unspecified: Secondary | ICD-10-CM | POA: Diagnosis not present

## 2022-01-01 DIAGNOSIS — E559 Vitamin D deficiency, unspecified: Secondary | ICD-10-CM | POA: Diagnosis not present

## 2022-01-01 DIAGNOSIS — F419 Anxiety disorder, unspecified: Secondary | ICD-10-CM

## 2022-01-01 DIAGNOSIS — Z6841 Body Mass Index (BMI) 40.0 and over, adult: Secondary | ICD-10-CM

## 2022-01-01 DIAGNOSIS — E7800 Pure hypercholesterolemia, unspecified: Secondary | ICD-10-CM

## 2022-01-01 MED ORDER — OLOPATADINE HCL 0.1 % OP SOLN: 1.0000 [drp] | Freq: Every day | OPHTHALMIC | 3 refills | Status: AC | PRN

## 2022-01-01 MED ORDER — VALACYCLOVIR HCL 500 MG PO TABS: ORAL_TABLET | ORAL | 3 refills | Status: DC

## 2022-01-01 MED ORDER — ESCITALOPRAM OXALATE 10 MG PO TABS: 10.0000 mg | ORAL_TABLET | Freq: Every day | ORAL | 1 refills | Status: DC

## 2022-01-01 NOTE — Patient Instructions (Signed)
Mediterranean Diet °A Mediterranean diet refers to food and lifestyle choices that are based on the traditions of countries located on the [place]. It focuses on eating more fruits, vegetables, whole grains, beans, nuts, seeds, and heart-healthy fats, and eating less dairy, meat, eggs, and processed foods with added sugar, salt, and fat. This way of eating has been shown to help prevent certain conditions and improve outcomes for people who have chronic diseases, like kidney disease and heart disease. °What are tips for following this plan? °Reading food labels °Check the serving size of packaged foods. For foods such as rice and pasta, the serving size refers to the amount of cooked product, not dry. °Check the total fat in packaged foods. Avoid foods that have saturated fat or trans fats. °Check the ingredient list for added sugars, such as corn syrup. °Shopping ° °Buy a variety of foods that offer a balanced diet, including: °Fresh fruits and vegetables (produce). °Grains, beans, nuts, and seeds. Some of these may be available in unpackaged forms or large amounts (in bulk). °Fresh seafood. °Poultry and eggs. °Low-fat dairy products. °Buy whole ingredients instead of prepackaged foods. °Buy fresh fruits and vegetables in-season from local farmers markets. °Buy plain frozen fruits and vegetables. °If you do not have access to quality fresh seafood, buy precooked frozen shrimp or canned fish, such as tuna, salmon, or sardines. °Stock your pantry so you always have certain foods on hand, such as olive oil, canned tuna, canned tomatoes, rice, pasta, and beans. °Cooking °Cook foods with extra-virgin olive oil instead of using butter or other vegetable oils. °Have meat as a side dish, and have vegetables or grains as your main dish. This means having meat in small portions or adding small amounts of meat to foods like pasta or stew. °Use beans or vegetables instead of meat in common dishes like chili or  lasagna. °Experiment with different cooking methods. Try roasting, broiling, steaming, and sautéing vegetables. °Add frozen vegetables to soups, stews, pasta, or rice. °Add nuts or seeds for added healthy fats and plant protein at each meal. You can add these to yogurt, salads, or vegetable dishes. °Marinate fish or vegetables using olive oil, lemon juice, garlic, and fresh herbs. °Meal planning °Plan to eat one vegetarian meal one day each week. Try to work up to two vegetarian meals, if possible. °Eat seafood two or more times a week. °Have healthy snacks readily available, such as: °Vegetable sticks with hummus. °Greek yogurt. °Fruit and nut trail mix. °Eat balanced meals throughout the week. This includes: °Fruit: 2-3 servings a day. °Vegetables: 4-5 servings a day. °Low-fat dairy: 2 servings a day. °Fish, poultry, or lean meat: 1 serving a day. °Beans and legumes: 2 or more servings a week. °Nuts and seeds: 1-2 servings a day. °Whole grains: 6-8 servings a day. °Extra-virgin olive oil: 3-4 servings a day. °Limit red meat and sweets to only a few servings a month. °Lifestyle ° °Cook and eat meals together with your family, when possible. °Drink enough fluid to keep your urine pale yellow. °Be physically active every day. This includes: °Aerobic exercise like running or swimming. °Leisure activities like gardening, walking, or housework. °Get 7-8 hours of sleep each night. °If recommended by your health care provider, drink red wine in moderation. This means 1 glass a day for nonpregnant women and 2 glasses a day for men. A glass of wine equals 5 oz (150 mL). °What foods should I eat? °Fruits °Apples. Apricots. Avocado. Berries. Bananas. Cherries. Dates.   Figs. Grapes. Lemons. Melon. Oranges. Peaches. Plums. Pomegranate. °Vegetables °Artichokes. Beets. Broccoli. Cabbage. Carrots. Eggplant. Green beans. Chard. Kale. Spinach. Onions. Leeks. Peas. Squash. Tomatoes. Peppers. Radishes. °Grains °Whole-grain pasta. Brown  rice. Bulgur wheat. Polenta. Couscous. Whole-wheat bread. Oatmeal. Quinoa. °Meats and other proteins °Beans. Almonds. Sunflower seeds. Pine nuts. Peanuts. Cod. Salmon. Scallops. Shrimp. Tuna. Tilapia. Clams. Oysters. Eggs. Poultry without skin. °Dairy °Low-fat milk. Cheese. Greek yogurt. °Fats and oils °Extra-virgin olive oil. Avocado oil. Grapeseed oil. °Beverages °Water. Red wine. Herbal tea. °Sweets and desserts °Greek yogurt with honey. Baked apples. Poached pears. Trail mix. °Seasonings and condiments °Basil. Cilantro. Coriander. Cumin. Mint. Parsley. Sage. Rosemary. Tarragon. Garlic. Oregano. Thyme. Pepper. Balsamic vinegar. Tahini. Hummus. Tomato sauce. Olives. Mushrooms. °The items listed above may not be a complete list of foods and beverages you can eat. Contact a dietitian for more information. °What foods should I limit? °This is a list of foods that should be eaten rarely or only on special occasions. °Fruits °Fruit canned in syrup. °Vegetables °Deep-fried potatoes (french fries). °Grains °Prepackaged pasta or rice dishes. Prepackaged cereal with added sugar. Prepackaged snacks with added sugar. °Meats and other proteins °Beef. Pork. Lamb. Poultry with skin. Hot dogs. Bacon. °Dairy °Ice cream. Sour cream. Whole milk. °Fats and oils °Butter. Canola oil. Vegetable oil. Beef fat (tallow). Lard. °Beverages °Juice. Sugar-sweetened soft drinks. Beer. Liquor and spirits. °Sweets and desserts °Cookies. Cakes. Pies. Candy. °Seasonings and condiments °Mayonnaise. Pre-made sauces and marinades. °The items listed above may not be a complete list of foods and beverages you should limit. Contact a dietitian for more information. °Summary °The Mediterranean diet includes both food and lifestyle choices. °Eat a variety of fresh fruits and vegetables, beans, nuts, seeds, and whole grains. °Limit the amount of red meat and sweets that you eat. °If recommended by your health care provider, drink red wine in moderation.  This means 1 glass a day for nonpregnant women and 2 glasses a day for men. A glass of wine equals 5 oz (150 mL). °This information is not intended to replace advice given to you by your health care provider. Make sure you discuss any questions you have with your health care provider. °Document Revised: 11/06/2019 Document Reviewed: 09/03/2019 °Elsevier Patient Education © 2022 [employer]. ° °

## 2022-01-01 NOTE — Progress Notes (Signed)
?[employer] as a Education administrator for [employer], FNP.,have documented all relevant documentation on the behalf of Minette Brine, FNP,as directed by  Minette Brine, FNP while in the presence of Minette Brine, [place].  ? ?This visit occurred during the SARS-CoV-2 public health emergency.  Safety protocols were in place, including screening questions prior to the visit, additional usage of staff PPE, and extensive cleaning of exam room while observing appropriate contact time as indicated for disinfecting solutions. ? ?Subjective:  ?  ? Patient ID: Tricia Curry , female    DOB: 02/27/1976 , 46 y.o.   MRN: [id number] ? ? ?Chief Complaint  ?Patient presents with  ? Hyperlipidemia  ? Anxiety  ? Depression  ? ? ?HPI ? ?The patient is here today for a cholesterol, anxiety, and depression follow-up.  She has an exercise bike that she is working out on. States " I am a lot better from where I was a year ago," ? ?Wt Readings from Last 3 Encounters: ?01/01/22 : 263 lb (119.3 kg) ?07/03/21 : 252 lb (114.3 kg) ?05/25/21 : 258 lb 12.8 oz (117.4 kg) ? ? ? ?Hyperlipidemia ?This is a chronic problem. The current episode started more than 1 year ago. The problem is controlled. Recent lipid tests were reviewed and are normal. There are no known factors aggravating her hyperlipidemia. Pertinent negatives include no chest pain.  ?Anxiety ?Presents for follow-up visit. Patient reports no chest pain, confusion, dizziness or nervous/anxious behavior.  ? ? ?Depression ?       This is a chronic problem.  The onset quality is gradual.   Associated symptoms include no fatigue.  Past medical history includes anxiety.    ? ?Past Medical History:  ?Diagnosis Date  ? Anemia   ? Anxiety   ? Asthma   ? Back pain   ? Constipation   ? Depression   ? Fatigue   ? Food allergy, peanut   ? GERD (gastroesophageal reflux disease)   ? Joint pain   ? Shortness of breath   ? Shortness of breath on exertion   ? Swelling of both lower extremities   ? Vitamin  D deficiency   ?  ? ?Family History  ?Problem Relation Age of Onset  ? Stroke Maternal Aunt   ? Breast cancer Sister   ? Hypertension Mother   ? Depression Mother   ? Anxiety disorder Mother   ? Sleep apnea Mother   ? ? ? ?Current Outpatient Medications:  ?  ACCU-CHEK GUIDE test strip, USE UP TO FOUR TIMES DAILY AS NEEDED, Disp: 100 strip, Rfl: 2 ?  Accu-Chek Softclix Lancets lancets, USE UP TO FOUR TIMES DAILY AS DIRECTED, Disp: 100 each, Rfl: 2 ?  albuterol (VENTOLIN HFA) 108 (90 Base) MCG/ACT inhaler, INHALE 1 TO 2 PUFFS INTO THE LUNGS EVERY 6 HOURS AS NEEDED FOR WHEEZING OR SHORTNESS OF BREATH, Disp: 18 g, Rfl: 2 ?  blood glucose meter kit and supplies KIT, Dispense based on patient and insurance preference. Use up to four times daily as directed. (FOR ICD-9 250.00, 250.01)., Disp: 1 each, Rfl: 0 ?  diclofenac Sodium (VOLTAREN) 1 % GEL, Apply 2 g topically 4 (four) times daily., Disp: 50 g, Rfl: 1 ?  diphenhydrAMINE (BENADRYL) 25 mg capsule, Take 25 mg by mouth daily as needed for allergies., Disp: , Rfl:  ?  glucose blood (ACCU-CHEK GUIDE) test strip, USE UP TO FOUR TIMES DAILY AS NEEDED, Disp: 100 strip, Rfl: 2 ?  olopatadine (PATANOL) 0.1 % ophthalmic  solution, Place 1 drop into both eyes daily as needed for allergies., Disp: 5 mL, Rfl: 3 ?  valACYclovir (VALTREX) 500 MG tablet, TAKE 1 TABLET(500 MG) BY MOUTH TWICE DAILY, Disp: 180 tablet, Rfl: 3 ?  Vitamin D, Ergocalciferol, (DRISDOL) 1.25 MG (50000 UNIT) CAPS capsule, TAKE 1 CAPSULE BY MOUTH EVERY 7 DAYS, Disp: 4 capsule, Rfl: 0 ?  escitalopram (LEXAPRO) 10 MG tablet, Take 1 tablet (10 mg total) by mouth daily., Disp: 90 tablet, Rfl: 1  ? ?Allergies  ?Allergen Reactions  ? Peanut-Containing Drug Products Anaphylaxis  ?  ? ?Review of Systems  ?Constitutional: Negative.  Negative for fatigue.  ?Respiratory: Negative.    ?Cardiovascular: Negative.  Negative for chest pain.  ?Gastrointestinal: Negative.   ?Endocrine: Negative.  Negative for polydipsia,  polyphagia and polyuria.  ?Skin: Negative.   ?Neurological: Negative.  Negative for dizziness.  ?Psychiatric/Behavioral:  Positive for depression. Negative for confusion. The patient is not nervous/anxious.    ? ?Today's Vitals  ? 01/01/22 0941  ?BP: 114/80  ?Pulse: 78  ?Temp: 99 ?F (37.2 ?C)  ?Weight: 263 lb (119.3 kg)  ?Height: _0  (1.676 m)  ? ?Body mass index is 42.45 kg/m?.  ?Wt Readings from Last 3 Encounters:  ?01/01/22 263 lb (119.3 kg)  ?07/03/21 252 lb (114.3 kg)  ?05/25/21 258 lb 12.8 oz (117.4 kg)  ?  ?BP Readings from Last 3 Encounters:  ?01/01/22 114/80  ?07/03/21 120/74  ?05/25/21 126/82  ?  ?Objective:  ?Physical Exam ?Vitals reviewed.  ?Constitutional:   ?   General: She is not in acute distress. ?   Appearance: Normal appearance. She is obese.  ?Cardiovascular:  ?   Rate and Rhythm: Normal rate and regular rhythm.  ?   Pulses: Normal pulses.  ?   Heart sounds: Normal heart sounds. No murmur heard. ?Pulmonary:  ?   Effort: Pulmonary effort is normal. No respiratory distress.  ?   Breath sounds: Normal breath sounds. No wheezing.  ?Skin: ?   General: Skin is warm and dry.  ?   Capillary Refill: Capillary refill takes less than 2 seconds.  ?Neurological:  ?   General: No focal deficit present.  ?   Mental Status: She is alert and oriented to person, place, and time.  ?   Cranial Nerves: No cranial nerve deficit.  ?   Motor: No weakness.  ?Psychiatric:     ?   Mood and Affect: Mood normal.     ?   Behavior: Behavior normal.     ?   Thought Content: Thought content normal.     ?   Judgment: Judgment normal.  ?  ? ?   ?Assessment And Plan:  ?   ?1. Elevated cholesterol ?Comments: Diet controlled, encouraged to continue with low fat diet.  ?- BMP8+EGFR ?- Lipid panel ? ?2. Anxiety and depression ?Comments: She is doing well with her escitalopram, continue current medications ?- escitalopram (LEXAPRO) 10 MG tablet; Take 1 tablet (10 mg total) by mouth daily.  Dispense: 90 tablet; Refill: 1 ? ?3. Vitamin  D deficiency ?Will check vitamin D level and supplement as needed.    ?Also encouraged to spend 15 minutes in the sun daily.  ?- VITAMIN D 25 Hydroxy (Vit-D Deficiency, Fractures) ? ?4. Class 3 severe obesity due to excess calories without serious comorbidity with body mass index (BMI) of 40.0 to 44.9 in adult [facility]) ? She is encouraged to strive for BMI less than 30 to decrease cardiac risk. Advised  to aim for at least 150 minutes of exercise per week. Encouraged to get outside more with the weather warming up. ? ? ?Patient was given opportunity to ask questions. Patient verbalized understanding of the plan and was able to repeat key elements of the plan. All questions were answered to their satisfaction.  ?Minette Brine, FNP  ? ?I, Minette Brine, FNP, have reviewed all documentation for this visit. The documentation on 01/01/22 for the exam, diagnosis, procedures, and orders are all accurate and complete.  ? ?IF YOU HAVE BEEN REFERRED TO A SPECIALIST, IT MAY TAKE 1-2 WEEKS TO SCHEDULE/PROCESS THE REFERRAL. IF YOU HAVE NOT HEARD FROM US/SPECIALIST IN TWO WEEKS, PLEASE GIVE [name] A CALL AT [phone] X [phone].  ? ?THE PATIENT IS ENCOURAGED TO PRACTICE SOCIAL DISTANCING DUE TO THE COVID-19 PANDEMIC.   ?

## 2022-01-02 LAB — BMP8+EGFR
BUN/Creatinine Ratio: 24 — ABNORMAL HIGH (ref 9–23)
BUN: 18 mg/dL (ref 6–24)
CO2: 23 mmol/L (ref 20–29)
Calcium: 9.3 mg/dL (ref 8.7–10.2)
Chloride: 101 mmol/L (ref 96–106)
Creatinine, Ser: 0.75 mg/dL (ref 0.57–1.00)
Glucose: 75 mg/dL (ref 70–99)
Potassium: 4.7 mmol/L (ref 3.5–5.2)
Sodium: 137 mmol/L (ref 134–144)
eGFR: 101 mL/min/{1.73_m2} (ref >59)

## 2022-01-02 LAB — LIPID PANEL
Chol/HDL Ratio: 3 ratio (ref 0.0–4.4)
Cholesterol, Total: 195 mg/dL (ref 100–199)
HDL: 64 mg/dL (ref >39)
LDL Chol Calc (NIH): 113 mg/dL — ABNORMAL HIGH (ref 0–99)
Triglycerides: 104 mg/dL (ref 0–149)
VLDL Cholesterol Cal: 18 mg/dL (ref 5–40)

## 2022-01-02 LAB — VITAMIN D 25 HYDROXY (VIT D DEFICIENCY, FRACTURES): Vit D, 25-Hydroxy: 22.7 ng/mL — ABNORMAL LOW (ref 30.0–100.0)

## 2022-01-10 DIAGNOSIS — F4323 Adjustment disorder with mixed anxiety and depressed mood: Secondary | ICD-10-CM | POA: Diagnosis not present

## 2022-01-24 DIAGNOSIS — F4323 Adjustment disorder with mixed anxiety and depressed mood: Secondary | ICD-10-CM | POA: Diagnosis not present

## 2022-02-07 DIAGNOSIS — F4323 Adjustment disorder with mixed anxiety and depressed mood: Secondary | ICD-10-CM | POA: Diagnosis not present

## 2022-02-14 DIAGNOSIS — F4323 Adjustment disorder with mixed anxiety and depressed mood: Secondary | ICD-10-CM | POA: Diagnosis not present

## 2022-02-21 DIAGNOSIS — F4323 Adjustment disorder with mixed anxiety and depressed mood: Secondary | ICD-10-CM | POA: Diagnosis not present

## 2022-02-28 DIAGNOSIS — F4323 Adjustment disorder with mixed anxiety and depressed mood: Secondary | ICD-10-CM | POA: Diagnosis not present

## 2022-03-07 DIAGNOSIS — F4323 Adjustment disorder with mixed anxiety and depressed mood: Secondary | ICD-10-CM | POA: Diagnosis not present

## 2022-03-14 DIAGNOSIS — F4323 Adjustment disorder with mixed anxiety and depressed mood: Secondary | ICD-10-CM | POA: Diagnosis not present

## 2022-04-18 DIAGNOSIS — F4323 Adjustment disorder with mixed anxiety and depressed mood: Secondary | ICD-10-CM | POA: Diagnosis not present

## 2022-04-25 DIAGNOSIS — F4323 Adjustment disorder with mixed anxiety and depressed mood: Secondary | ICD-10-CM | POA: Diagnosis not present

## 2022-05-01 ENCOUNTER — Other Ambulatory Visit (INDEPENDENT_AMBULATORY_CARE_PROVIDER_SITE_OTHER): Payer: Medicaid Other

## 2022-05-01 DIAGNOSIS — Z111 Encounter for screening for respiratory tuberculosis: Secondary | ICD-10-CM | POA: Diagnosis not present

## 2022-05-02 DIAGNOSIS — F4323 Adjustment disorder with mixed anxiety and depressed mood: Secondary | ICD-10-CM | POA: Diagnosis not present

## 2022-05-04 LAB — QUANTIFERON-TB GOLD PLUS
QuantiFERON Mitogen Value: >10 IU/mL
QuantiFERON Nil Value: 0.02 IU/mL
QuantiFERON TB1 Ag Value: 0.02 IU/mL
QuantiFERON TB2 Ag Value: 0.02 IU/mL
QuantiFERON-TB Gold Plus: NEGATIVE

## 2022-05-09 ENCOUNTER — Encounter: Payer: Self-pay | Admitting: Nurse Practitioner

## 2022-05-09 DIAGNOSIS — F4323 Adjustment disorder with mixed anxiety and depressed mood: Secondary | ICD-10-CM | POA: Diagnosis not present

## 2022-05-16 DIAGNOSIS — F4323 Adjustment disorder with mixed anxiety and depressed mood: Secondary | ICD-10-CM | POA: Diagnosis not present

## 2022-05-23 ENCOUNTER — Encounter (INDEPENDENT_AMBULATORY_CARE_PROVIDER_SITE_OTHER): Payer: Self-pay

## 2022-06-07 DIAGNOSIS — F4323 Adjustment disorder with mixed anxiety and depressed mood: Secondary | ICD-10-CM | POA: Diagnosis not present

## 2022-06-21 DIAGNOSIS — F4323 Adjustment disorder with mixed anxiety and depressed mood: Secondary | ICD-10-CM | POA: Diagnosis not present

## 2022-07-03 DIAGNOSIS — F4323 Adjustment disorder with mixed anxiety and depressed mood: Secondary | ICD-10-CM | POA: Diagnosis not present

## 2022-07-09 ENCOUNTER — Encounter: Payer: Medicaid Other | Admitting: Nurse Practitioner

## 2022-08-02 DIAGNOSIS — F4323 Adjustment disorder with mixed anxiety and depressed mood: Secondary | ICD-10-CM | POA: Diagnosis not present

## 2022-08-16 DIAGNOSIS — F4323 Adjustment disorder with mixed anxiety and depressed mood: Secondary | ICD-10-CM | POA: Diagnosis not present

## 2022-08-30 DIAGNOSIS — F4323 Adjustment disorder with mixed anxiety and depressed mood: Secondary | ICD-10-CM | POA: Diagnosis not present

## 2022-09-09 ENCOUNTER — Encounter (HOSPITAL_COMMUNITY): Payer: Self-pay

## 2022-09-09 ENCOUNTER — Emergency Department (HOSPITAL_COMMUNITY)
Admission: EM | Admit: 2022-09-09 | Discharge: 2022-09-10 | Discharge status: Against Medical Advice | Payer: Medicaid Other | Attending: Emergency Medicine | Admitting: Emergency Medicine

## 2022-09-09 ENCOUNTER — Other Ambulatory Visit: Payer: Self-pay

## 2022-09-09 DIAGNOSIS — K6289 Other specified diseases of anus and rectum: Secondary | ICD-10-CM | POA: Insufficient documentation

## 2022-09-09 DIAGNOSIS — Z5321 Procedure and treatment not carried out due to patient leaving prior to being seen by health care provider: Secondary | ICD-10-CM | POA: Diagnosis not present

## 2022-09-09 NOTE — ED Provider Triage Note (Signed)
Emergency Medicine Provider Triage Evaluation Note  Tricia Curry , Curry 46 y.o. female  was evaluated in triage.  Pt complains of rectal pain. Feels like Curry hemorrhoid. No bleeding, abd pain. No hx of abscess  Review of Systems  Positive: Rectal pain Negative:   Physical Exam  There were no vitals taken for this visit. Gen:   Awake, no distress   Resp:  Normal effort  MSK:   Moves extremities without difficulty  GU:  Deferred until room in back Other:    Medical Decision Making  Medically screening exam initiated at 6:29 PM.  Appropriate orders placed.  Tricia Curry was informed that the remainder of the evaluation will be completed by another provider, this initial triage assessment does not replace that evaluation, and the importance of remaining in the ED until their evaluation is complete.  Rectal pain   Tricia Kukla A, PA-C 09/09/22 1830

## 2022-09-09 NOTE — ED Triage Notes (Signed)
Pt reports rectal pain since Wednesday, she reports it feels like a hemorrhoid and she can see it "it looks puffy." Pain unrelieved with cream or suppositories. Denies rectal bleeding. Denies abd pain.

## 2022-09-10 NOTE — ED Notes (Signed)
Pt decided to leave while waiting for a room.  

## 2022-09-13 DIAGNOSIS — F4323 Adjustment disorder with mixed anxiety and depressed mood: Secondary | ICD-10-CM | POA: Diagnosis not present

## 2022-09-27 DIAGNOSIS — F4323 Adjustment disorder with mixed anxiety and depressed mood: Secondary | ICD-10-CM | POA: Diagnosis not present

## 2022-11-08 DIAGNOSIS — F4323 Adjustment disorder with mixed anxiety and depressed mood: Secondary | ICD-10-CM | POA: Diagnosis not present

## 2022-11-22 DIAGNOSIS — F4323 Adjustment disorder with mixed anxiety and depressed mood: Secondary | ICD-10-CM | POA: Diagnosis not present

## 2022-12-19 DIAGNOSIS — F4323 Adjustment disorder with mixed anxiety and depressed mood: Secondary | ICD-10-CM | POA: Diagnosis not present

## 2023-01-17 DIAGNOSIS — F4323 Adjustment disorder with mixed anxiety and depressed mood: Secondary | ICD-10-CM | POA: Diagnosis not present

## 2023-01-24 ENCOUNTER — Encounter: Payer: Self-pay | Admitting: Nurse Practitioner

## 2023-01-31 DIAGNOSIS — F4323 Adjustment disorder with mixed anxiety and depressed mood: Secondary | ICD-10-CM | POA: Diagnosis not present

## 2023-02-13 ENCOUNTER — Encounter: Payer: Medicaid Other | Admitting: Nurse Practitioner

## 2023-02-21 DIAGNOSIS — F4323 Adjustment disorder with mixed anxiety and depressed mood: Secondary | ICD-10-CM | POA: Diagnosis not present

## 2023-03-07 DIAGNOSIS — F4323 Adjustment disorder with mixed anxiety and depressed mood: Secondary | ICD-10-CM | POA: Diagnosis not present

## 2023-03-15 ENCOUNTER — Encounter (HOSPITAL_COMMUNITY): Payer: Self-pay

## 2023-03-15 ENCOUNTER — Emergency Department (HOSPITAL_COMMUNITY)
Admission: EM | Admit: 2023-03-15 | Discharge: 2023-03-15 | Disposition: A | Discharge status: Home / Self Care | Payer: Medicaid Other | Attending: Emergency Medicine | Admitting: Emergency Medicine

## 2023-03-15 ENCOUNTER — Emergency Department (HOSPITAL_COMMUNITY): Payer: Medicaid Other

## 2023-03-15 DIAGNOSIS — Z91010 Allergy to peanuts: Secondary | ICD-10-CM | POA: Diagnosis not present

## 2023-03-15 DIAGNOSIS — K5732 Diverticulitis of large intestine without perforation or abscess without bleeding: Secondary | ICD-10-CM | POA: Diagnosis not present

## 2023-03-15 DIAGNOSIS — D649 Anemia, unspecified: Secondary | ICD-10-CM | POA: Insufficient documentation

## 2023-03-15 DIAGNOSIS — K5792 Diverticulitis of intestine, part unspecified, without perforation or abscess without bleeding: Secondary | ICD-10-CM | POA: Diagnosis not present

## 2023-03-15 DIAGNOSIS — J45909 Unspecified asthma, uncomplicated: Secondary | ICD-10-CM | POA: Diagnosis not present

## 2023-03-15 DIAGNOSIS — R109 Unspecified abdominal pain: Secondary | ICD-10-CM | POA: Diagnosis not present

## 2023-03-15 DIAGNOSIS — R1031 Right lower quadrant pain: Secondary | ICD-10-CM | POA: Diagnosis present

## 2023-03-15 LAB — URINALYSIS, ROUTINE W REFLEX MICROSCOPIC
Bilirubin Urine: NEGATIVE
Glucose, UA: NEGATIVE mg/dL
Hgb urine dipstick: NEGATIVE
Ketones, ur: NEGATIVE mg/dL
Nitrite: NEGATIVE
Protein, ur: NEGATIVE mg/dL
Specific Gravity, Urine: 1.015 (ref 1.005–1.030)
pH: 5 (ref 5.0–8.0)

## 2023-03-15 LAB — LIPASE, BLOOD: Lipase: 30 U/L (ref 11–51)

## 2023-03-15 LAB — CBC
HCT: 36.1 % (ref 36.0–46.0)
Hemoglobin: 11.1 g/dL — ABNORMAL LOW (ref 12.0–15.0)
MCH: 25.1 pg — ABNORMAL LOW (ref 26.0–34.0)
MCHC: 30.7 g/dL (ref 30.0–36.0)
MCV: 81.7 fL (ref 80.0–100.0)
Platelets: 306 10*3/uL (ref 150–400)
RBC: 4.42 MIL/uL (ref 3.87–5.11)
RDW: 15.3 % (ref 11.5–15.5)
WBC: 6.6 10*3/uL (ref 4.0–10.5)
nRBC: 0 % (ref 0.0–0.2)

## 2023-03-15 LAB — COMPREHENSIVE METABOLIC PANEL
ALT: 20 U/L (ref 0–44)
AST: 16 U/L (ref 15–41)
Albumin: 3.5 g/dL (ref 3.5–5.0)
Alkaline Phosphatase: 58 U/L (ref 38–126)
Anion gap: 6 (ref 5–15)
BUN: 8 mg/dL (ref 6–20)
CO2: 28 mmol/L (ref 22–32)
Calcium: 8.5 mg/dL — ABNORMAL LOW (ref 8.9–10.3)
Chloride: 101 mmol/L (ref 98–111)
Creatinine, Ser: 0.74 mg/dL (ref 0.44–1.00)
GFR, Estimated: >60 mL/min (ref >60)
Glucose, Bld: 107 mg/dL — ABNORMAL HIGH (ref 70–99)
Potassium: 3.8 mmol/L (ref 3.5–5.1)
Sodium: 135 mmol/L (ref 135–145)
Total Bilirubin: 0.4 mg/dL (ref 0.3–1.2)
Total Protein: 7.5 g/dL (ref 6.5–8.1)

## 2023-03-15 LAB — I-STAT BETA HCG BLOOD, ED (MC, WL, AP ONLY): I-stat hCG, quantitative: <5 m[IU]/mL (ref <5)

## 2023-03-15 MED ORDER — LACTATED RINGERS IV BOLUS
1000.0000 mL | Freq: Once | INTRAVENOUS | Status: AC
  Administered 2023-03-15: 1000 mL via INTRAVENOUS

## 2023-03-15 MED ORDER — IOHEXOL 350 MG/ML SOLN
75.0000 mL | Freq: Once | INTRAVENOUS | Status: AC | PRN
  Administered 2023-03-15: 75 mL via INTRAVENOUS

## 2023-03-15 MED ORDER — ONDANSETRON 4 MG PO TBDP: 4.0000 mg | ORAL_TABLET | Freq: Three times a day (TID) | ORAL | 0 refills | Status: DC | PRN

## 2023-03-15 MED ORDER — IBUPROFEN 400 MG PO TABS
600.0000 mg | ORAL_TABLET | Freq: Once | ORAL | Status: AC
  Administered 2023-03-15: 600 mg via ORAL
  Filled 2023-03-15: qty 1

## 2023-03-15 MED ORDER — AMOXICILLIN-POT CLAVULANATE 875-125 MG PO TABS: 1.0000 | ORAL_TABLET | Freq: Two times a day (BID) | ORAL | 0 refills | Status: AC

## 2023-03-15 NOTE — ED Provider Notes (Signed)
[place] EMERGENCY DEPARTMENT AT [facility] Provider Note   CSN: [id number] Arrival date & time: 03/15/23  1717     History  Chief Complaint  Patient presents with   Abdominal Pain    Tricia Curry is a 47 y.o. female.  47 year old female history of GERD, asthma, anxiety presenting for abdominal pain.  Patient is since Tuesday she has had intermittent pain on her right side.  It is worse with eating, it is mostly in her right lower quadrant.  Some suprapubic pain as well.  No dysuria or hematuria.  No vomiting.  She has had slightly decreased bowel movements lately but is passing gas and has not been eating as much.  She still has her gallbladder and appendix.  No fevers or chills.  No chest pain or difficulty breathing.  No melena or hematochezia.   Abdominal Pain      Home Medications Prior to Admission medications   Medication Sig Start Date End Date Taking? Authorizing Provider  amoxicillin-clavulanate (AUGMENTIN) 875-125 MG tablet Take 1 tablet by mouth every 12 (twelve) hours for 10 days. 03/15/23 03/25/23 Yes Fulton Reek, MD  ondansetron (ZOFRAN-ODT) 4 MG disintegrating tablet Take 1 tablet (4 mg total) by mouth every 8 (eight) hours as needed for nausea or vomiting. 03/15/23  Yes Fulton Reek, MD  ACCU-CHEK GUIDE test strip USE UP TO FOUR TIMES DAILY AS NEEDED 11/02/20   Arnette Felts, FNP  Accu-Chek Softclix Lancets lancets USE UP TO FOUR TIMES DAILY AS DIRECTED 09/21/20   Dorothyann Peng, MD  albuterol (VENTOLIN HFA) 108 (90 Base) MCG/ACT inhaler INHALE 1 TO 2 PUFFS INTO THE LUNGS EVERY 6 HOURS AS NEEDED FOR WHEEZING OR SHORTNESS OF BREATH 09/14/19   Rodriguez-Southworth, Nettie Elm, PA-C  blood glucose meter kit and supplies KIT Dispense based on patient and insurance preference. Use up to four times daily as directed. (FOR ICD-9 250.00, 250.01). 08/29/20   Arnette Felts, FNP  diclofenac Sodium (VOLTAREN) 1 % GEL Apply 2 g topically 4 (four) times daily.  03/26/21   Placido Sou, PA-C  diphenhydrAMINE (BENADRYL) 25 mg capsule Take 25 mg by mouth daily as needed for allergies.    [provider]  escitalopram (LEXAPRO) 10 MG tablet Take 1 tablet (10 mg total) by mouth daily. 01/01/22 04/01/22  Arnette Felts, FNP  glucose blood (ACCU-CHEK GUIDE) test strip USE UP TO FOUR TIMES DAILY AS NEEDED 11/07/20   Arnette Felts, FNP  olopatadine (PATANOL) 0.1 % ophthalmic solution Place 1 drop into both eyes daily as needed for allergies. 01/01/22   Arnette Felts, FNP  valACYclovir (VALTREX) 500 MG tablet TAKE 1 TABLET(500 MG) BY MOUTH TWICE DAILY 01/01/22   Arnette Felts, FNP  Vitamin D, Ergocalciferol, (DRISDOL) 1.25 MG (50000 UNIT) CAPS capsule TAKE 1 CAPSULE BY MOUTH EVERY 7 DAYS 02/21/21   Alois Cliche, PA-C      Allergies    Peanut-containing drug products    Review of Systems   Review of Systems  Gastrointestinal:  Positive for abdominal pain.    Physical Exam Updated Vital Signs BP (!) 140/99   Pulse 90   Temp 97.9 F (36.6 C) (Oral)   Resp 17   Ht 5\' 6"  (1.676 m)   Wt 129.7 kg   LMP 02/01/2023   SpO2 100%   BMI 46.16 kg/m  Physical Exam Vitals and nursing note reviewed.  Constitutional:      General: She is not in acute distress.    Appearance: She is well-developed.  HENT:  Head: Normocephalic and atraumatic.  Eyes:     Conjunctiva/sclera: Conjunctivae normal.  Cardiovascular:     Rate and Rhythm: Normal rate and regular rhythm.     Heart sounds: No murmur heard. Pulmonary:     Effort: Pulmonary effort is normal. No respiratory distress.     Breath sounds: Normal breath sounds.  Abdominal:     Palpations: Abdomen is soft.     Tenderness: There is abdominal tenderness in the right lower quadrant and suprapubic area. There is no right CVA tenderness, left CVA tenderness, guarding or rebound. Negative signs include Murphy's sign.  Musculoskeletal:        General: No swelling.     Cervical back: Neck supple.   Skin:    General: Skin is warm and dry.     Capillary Refill: Capillary refill takes less than 2 seconds.  Neurological:     Mental Status: She is alert.  Psychiatric:        Mood and Affect: Mood normal.     ED Results / Procedures / Treatments   Labs (all labs ordered are listed, but only abnormal results are displayed) Labs Reviewed  COMPREHENSIVE METABOLIC PANEL - Abnormal; Notable for the following components:      Result Value   Glucose, Bld 107 (*)    Calcium 8.5 (*)    All other components within normal limits  CBC - Abnormal; Notable for the following components:   Hemoglobin 11.1 (*)    MCH 25.1 (*)    All other components within normal limits  URINALYSIS, ROUTINE W REFLEX MICROSCOPIC - Abnormal; Notable for the following components:   Leukocytes,Ua SMALL (*)    Bacteria, UA RARE (*)    All other components within normal limits  LIPASE, BLOOD  I-STAT BETA HCG BLOOD, ED ([facility], [facility], [facility] ONLY)    EKG None  Radiology CT ABDOMEN PELVIS W CONTRAST  Result Date: 03/15/2023 CLINICAL DATA:  Right lower quadrant abdominal pain. EXAM: CT ABDOMEN AND PELVIS WITH CONTRAST TECHNIQUE: Multidetector CT imaging of the abdomen and pelvis was performed using the standard protocol following bolus administration of intravenous contrast. RADIATION DOSE REDUCTION: This exam was performed according to the departmental dose-optimization program which includes automated exposure control, adjustment of the mA and/or kV according to patient size and/or use of iterative reconstruction technique. CONTRAST:  75mL OMNIPAQUE IOHEXOL 350 MG/ML SOLN COMPARISON:  None Available. FINDINGS: Lower chest: No acute abnormality. Hepatobiliary: No focal liver abnormality is seen. No gallstones, gallbladder wall thickening, or biliary dilatation. Pancreas: Unremarkable. No pancreatic ductal dilatation or surrounding inflammatory changes. Spleen: Normal in size without focal abnormality. Adrenals/Urinary Tract:  Adrenal glands are unremarkable. Kidneys are normal, without renal calculi, focal lesion, or hydronephrosis. Bladder is unremarkable. Stomach/Bowel: Stomach is within normal limits. Appendix appears normal. Colonic diverticulosis with focal mild wall thickening and adjacent fat stranding of the sigmoid colon suggesting mild acute diverticulitis (series 3, image 59-60; coronal image 58-59). No fluid collection or abscess. Vascular/Lymphatic: No significant vascular findings are present. No enlarged abdominal or pelvic lymph nodes. Reproductive: Uterus and bilateral adnexa are unremarkable. Other: Fat containing umbilical hernia.  No abdominopelvic ascites. Musculoskeletal: No acute or significant osseous findings. IMPRESSION: 1. Colonic diverticulosis with focal mild wall thickening and adjacent fat stranding of the sigmoid colon suggesting mild acute diverticulitis. No fluid collection or abscess. 2. Normal appendix. 3. Fat containing umbilical hernia. Electronically Signed   By: Larose Hires D.O.   On: 03/15/2023 22:04    Procedures Procedures  Medications Ordered in ED Medications  lactated ringers bolus 1,000 mL (0 mLs Intravenous Stopped 03/15/23 2225)  iohexol (OMNIPAQUE) 350 MG/ML injection 75 mL (75 mLs Intravenous Contrast Given 03/15/23 2152)  ibuprofen (ADVIL) tablet 600 mg (600 mg Oral Given 03/15/23 2246)    ED Course/ Medical Decision Making/ A&P Clinical Course as of 03/15/23 2321  Fri Mar 15, 2023  2103 RLQ pain,  CT pending Stable [CC]    Clinical Course User Index [CC] Glyn Ade, MD                             Medical Decision Making Amount and/or Complexity of Data Reviewed Labs: ordered. Radiology: ordered.  Risk Prescription drug management.   47 year old female presenting for abdominal pain.  Vital signs reviewed.  On exam she has right lower quadrant and suprapubic tenderness.  Differential including appendicitis, diverticulitis, obstruction,  gastroenteritis, cholecystitis.  Offered her pain medication, she did not want any.  She is no urinary symptoms.  No chest pain or difficulty breathing to suggest ACS or PE.  No GU symptoms.  Lab work reviewed, CBC, CMP are notable for anemia stable from prior.  Urinalysis with small bacteria but no significant leukocytes on urinalysis less consistent with infection especially no symptoms.  Her CT scan is concerning for diverticulitis.  She is no signs of perforation or complication.  She is tolerating p.o.  Given this I think she is stable for outpatient management with antibiotics.  Given prescription for Augmentin.  Recommend close PCP follow-up.  Discharged with return precautions.  Also recommended liquid diet for the next few days, Tylenol, Motrin for pain.        Final Clinical Impression(s) / ED Diagnoses Final diagnoses:  Diverticulitis    Rx / DC Orders ED Discharge Orders          Ordered    amoxicillin-clavulanate (AUGMENTIN) 875-125 MG tablet  Every 12 hours        03/15/23 2247    ondansetron (ZOFRAN-ODT) 4 MG disintegrating tablet  Every 8 hours PRN        03/15/23 2247              Fulton Reek, MD 03/15/23 1610    Glyn Ade, MD 03/16/23 1452

## 2023-03-15 NOTE — Discharge Instructions (Addendum)
You were seen today for abdominal pain.  Your CT scan shows diverticulitis.  You should complete the entire course of antibiotics and call your doctor on Monday to schedule follow-up.  You should drink plenty of fluids.  If you develop severe abdominal pain, inability eat or drink or any other new concerning symptoms you should return to the ED.

## 2023-03-15 NOTE — ED Triage Notes (Signed)
Pt c/o lower abd pain, mostly on right side since Tuesday. Pt states pain increases with eating. Pt c/o pressure when voiding.    Pt denies N/V/D.

## 2023-03-19 ENCOUNTER — Encounter: Payer: Self-pay | Admitting: Nurse Practitioner

## 2023-03-19 ENCOUNTER — Telehealth: Payer: Self-pay

## 2023-03-19 NOTE — Transitions of Care (Post Inpatient/ED Visit) (Signed)
   03/19/2023  Name: Tricia Curry MRN: [id number] DOB: 09/11/1976  Today's TOC FU Call Status: Today's TOC FU Call Status:: Unsuccessul Call (1st Attempt) Unsuccessful Call (1st Attempt) Date: 03/19/23  Attempted to reach the patient regarding the most recent Inpatient/ED visit.  Follow Up Plan: Additional outreach attempts will be made to reach the patient to complete the Transitions of Care (Post Inpatient/ED visit) call.   Signature YL,RMA

## 2023-03-21 DIAGNOSIS — F4323 Adjustment disorder with mixed anxiety and depressed mood: Secondary | ICD-10-CM | POA: Diagnosis not present

## 2023-04-02 ENCOUNTER — Ambulatory Visit (INDEPENDENT_AMBULATORY_CARE_PROVIDER_SITE_OTHER): Payer: Medicaid Other | Admitting: Nurse Practitioner

## 2023-04-02 ENCOUNTER — Encounter: Payer: Self-pay | Admitting: Nurse Practitioner

## 2023-04-02 VITALS — BP 120/74 | HR 98 | Temp 98.3°F | Ht 66.0 in | Wt 279.2 lb

## 2023-04-02 DIAGNOSIS — Z6841 Body Mass Index (BMI) 40.0 and over, adult: Secondary | ICD-10-CM | POA: Diagnosis not present

## 2023-04-02 DIAGNOSIS — F32A Depression, unspecified: Secondary | ICD-10-CM | POA: Diagnosis not present

## 2023-04-02 DIAGNOSIS — E88819 Insulin resistance, unspecified: Secondary | ICD-10-CM

## 2023-04-02 DIAGNOSIS — Z0000 Encounter for general adult medical examination without abnormal findings: Secondary | ICD-10-CM | POA: Diagnosis not present

## 2023-04-02 DIAGNOSIS — Z1211 Encounter for screening for malignant neoplasm of colon: Secondary | ICD-10-CM

## 2023-04-02 DIAGNOSIS — E7800 Pure hypercholesterolemia, unspecified: Secondary | ICD-10-CM | POA: Diagnosis not present

## 2023-04-02 DIAGNOSIS — F419 Anxiety disorder, unspecified: Secondary | ICD-10-CM | POA: Diagnosis not present

## 2023-04-02 DIAGNOSIS — Z23 Encounter for immunization: Secondary | ICD-10-CM

## 2023-04-02 DIAGNOSIS — Z114 Encounter for screening for human immunodeficiency virus [HIV]: Secondary | ICD-10-CM | POA: Diagnosis not present

## 2023-04-02 DIAGNOSIS — Z1159 Encounter for screening for other viral diseases: Secondary | ICD-10-CM

## 2023-04-02 DIAGNOSIS — E559 Vitamin D deficiency, unspecified: Secondary | ICD-10-CM | POA: Diagnosis not present

## 2023-04-02 DIAGNOSIS — D573 Sickle-cell trait: Secondary | ICD-10-CM | POA: Diagnosis not present

## 2023-04-02 DIAGNOSIS — Z1231 Encounter for screening mammogram for malignant neoplasm of breast: Secondary | ICD-10-CM

## 2023-04-02 DIAGNOSIS — Z113 Encounter for screening for infections with a predominantly sexual mode of transmission: Secondary | ICD-10-CM

## 2023-04-02 DIAGNOSIS — K5792 Diverticulitis of intestine, part unspecified, without perforation or abscess without bleeding: Secondary | ICD-10-CM | POA: Diagnosis not present

## 2023-04-02 DIAGNOSIS — Z124 Encounter for screening for malignant neoplasm of cervix: Secondary | ICD-10-CM

## 2023-04-02 NOTE — Progress Notes (Signed)
I,Deema Juncaj,acting as a Neurosurgeon for Arnette Felts, FNP.,have documented all relevant documentation on the behalf of Arnette Felts, FNP,as directed by  Arnette Felts, FNP while in the presence of Arnette Felts, FNP.  Subjective:    Patient ID: Tricia Curry , female    DOB: [birth year] , 47 y.o.   MRN: [id number]  Chief Complaint  Patient presents with   Annual Exam    HPI  Patient presents today for HM, patient reports compliance with medications. Patient reports she was recently diagnosed with diverticulitis, she wants to know what to do for her discomfort.  Patient reports she is changing her diet, a lot of foods are hurting her stomach.   Denies any chest pain, SOB, headaches.   BP Readings from Last 3 Encounters: 04/02/23 : 120/74 03/15/23 : (!) 140/99 09/10/22 : 117/81   Wt Readings from Last 3 Encounters: 04/02/23 : 279 lb 3.2 oz (126.6 kg) 03/15/23 : 286 lb (129.7 kg) 09/09/22 : 275 lb (124.7 kg)       Past Medical History:  Diagnosis Date   Anemia    Anxiety    Asthma    Back pain    Constipation    Depression    Fatigue    Food allergy, peanut    GERD (gastroesophageal reflux disease)    Insulin resistance 08/29/2020   Joint pain    Shortness of breath    Shortness of breath on exertion    Swelling of both lower extremities    Vitamin D deficiency      Family History  Problem Relation Age of Onset   Stroke Maternal Aunt    Breast cancer Sister    Hypertension Mother    Depression Mother    Anxiety disorder Mother    Sleep apnea Mother      Current Outpatient Medications:    ACCU-CHEK GUIDE test strip, USE UP TO FOUR TIMES DAILY AS NEEDED, Disp: 100 strip, Rfl: 2   Accu-Chek Softclix Lancets lancets, USE UP TO FOUR TIMES DAILY AS DIRECTED, Disp: 100 each, Rfl: 2   albuterol (VENTOLIN HFA) 108 (90 Base) MCG/ACT inhaler, INHALE 1 TO 2 PUFFS INTO THE LUNGS EVERY 6 HOURS AS NEEDED FOR WHEEZING OR SHORTNESS OF BREATH, Disp: 18 g, Rfl: 2   blood  glucose meter kit and supplies KIT, Dispense based on patient and insurance preference. Use up to four times daily as directed. (FOR ICD-9 250.00, 250.01)., Disp: 1 each, Rfl: 0   diclofenac Sodium (VOLTAREN) 1 % GEL, Apply 2 g topically 4 (four) times daily., Disp: 50 g, Rfl: 1   diphenhydrAMINE (BENADRYL) 25 mg capsule, Take 25 mg by mouth daily as needed for allergies., Disp: , Rfl:    glucose blood (ACCU-CHEK GUIDE) test strip, USE UP TO FOUR TIMES DAILY AS NEEDED, Disp: 100 strip, Rfl: 2   olopatadine (PATANOL) 0.1 % ophthalmic solution, Place 1 drop into both eyes daily as needed for allergies., Disp: 5 mL, Rfl: 3   ondansetron (ZOFRAN-ODT) 4 MG disintegrating tablet, Take 1 tablet (4 mg total) by mouth every 8 (eight) hours as needed for nausea or vomiting., Disp: 8 tablet, Rfl: 0   valACYclovir (VALTREX) 500 MG tablet, TAKE 1 TABLET(500 MG) BY MOUTH TWICE DAILY, Disp: 180 tablet, Rfl: 3   Vitamin D, Ergocalciferol, (DRISDOL) 1.25 MG (50000 UNIT) CAPS capsule, TAKE 1 CAPSULE BY MOUTH EVERY 7 DAYS, Disp: 4 capsule, Rfl: 0   escitalopram (LEXAPRO) 10 MG tablet, Take 1 tablet (10 mg total) by  mouth daily. (Patient not taking: Reported on 04/02/2023), Disp: 90 tablet, Rfl: 1   Allergies  Allergen Reactions   Peanut-Containing Drug Products Anaphylaxis      The patient states she has had a tubal ligation.  Denies being sexually active at this time. Patient's last menstrual period was 02/01/2023.  Negative for Dysmenorrhea and Negative for Menorrhagia. Negative for: breast discharge, breast lump(s), breast pain and breast self exam. Associated symptoms include abnormal vaginal bleeding. Pertinent negatives include abnormal bleeding (hematology), anxiety, decreased libido, depression, difficulty falling sleep, dyspareunia, history of infertility, nocturia, sexual dysfunction, sleep disturbances, urinary incontinence, urinary urgency, vaginal discharge and vaginal itching. Diet regular.The patient  states her exercise level is    . The patient's tobacco use is:  Social History   Tobacco Use  Smoking Status Former  Smokeless Tobacco Never  Tobacco Comments   smoked for approx 2 years during teenhood  . She has been exposed to passive smoke. The patient's alcohol use is:  Social History   Substance and Sexual Activity  Alcohol Use Yes   Alcohol/week: 2.0 standard drinks of alcohol   Types: 2 Cans of beer per week  . Additional information: Last pap unknown, next one not scheduled would like referral to GYN  Review of Systems  Constitutional: Negative.   HENT: Negative.    Eyes: Negative.   Respiratory: Negative.    Cardiovascular: Negative.   Gastrointestinal: Negative.   Endocrine: Negative.   Genitourinary: Negative.   Musculoskeletal: Negative.   Skin: Negative.   Allergic/Immunologic: Negative.   Neurological: Negative.   Hematological: Negative.   Psychiatric/Behavioral: Negative.       Today's Vitals   04/02/23 1628  BP: 120/74  Pulse: 98  Temp: 98.3 F (36.8 C)  TempSrc: Oral  Weight: 279 lb 3.2 oz (126.6 kg)  Height: 5\' 6"  (1.676 m)  PainSc: 2   PainLoc: Abdomen   Body mass index is 45.06 kg/m.  Wt Readings from Last 3 Encounters:  04/02/23 279 lb 3.2 oz (126.6 kg)  03/15/23 286 lb (129.7 kg)  09/09/22 275 lb (124.7 kg)     Objective:  Physical Exam Vitals reviewed.  Constitutional:      General: She is not in acute distress.    Appearance: Normal appearance. She is well-developed. She is obese.  HENT:     Head: Normocephalic and atraumatic.     Right Ear: Hearing, tympanic membrane, ear canal and external ear normal. There is no impacted cerumen.     Left Ear: Hearing, tympanic membrane, ear canal and external ear normal. There is no impacted cerumen.     Nose: Nose normal.     Mouth/Throat:     Mouth: Mucous membranes are moist.  Eyes:     General: Lids are normal.     Extraocular Movements: Extraocular movements intact.      Conjunctiva/sclera: Conjunctivae normal.     Pupils: Pupils are equal, round, and reactive to light.     Funduscopic exam:    Right eye: No papilledema.        Left eye: No papilledema.  Neck:     Thyroid: No thyroid mass.     Vascular: No carotid bruit.  Cardiovascular:     Rate and Rhythm: Normal rate and regular rhythm.     Pulses: Normal pulses.     Heart sounds: Normal heart sounds. No murmur heard. Pulmonary:     Effort: Pulmonary effort is normal.     Breath sounds: Normal breath sounds.  Abdominal:     General: Abdomen is flat. Bowel sounds are normal. There is no distension.     Palpations: Abdomen is soft.     Tenderness: There is no abdominal tenderness.  Genitourinary:    Comments: Referred to GYN Musculoskeletal:        General: No swelling or tenderness. Normal range of motion.     Cervical back: Full passive range of motion without pain, normal range of motion and neck supple.     Right lower leg: No edema.     Left lower leg: No edema.  Skin:    General: Skin is warm and dry.     Capillary Refill: Capillary refill takes less than 2 seconds.  Neurological:     General: No focal deficit present.     Mental Status: She is alert and oriented to person, place, and time.     Cranial Nerves: No cranial nerve deficit.     Sensory: No sensory deficit.     Motor: No weakness.  Psychiatric:        Mood and Affect: Mood normal.        Behavior: Behavior normal.        Thought Content: Thought content normal.        Judgment: Judgment normal.         Assessment And Plan:     Encounter for annual health examination Assessment & Plan: Behavior modifications discussed and diet history reviewed.   Pt will continue to exercise regularly and modify diet with low GI, plant based foods and decrease intake of processed foods.  Recommend intake of daily multivitamin, Vitamin D, and calcium.  Recommend mammogram and colonoscopy for preventive screenings, as well as  recommend immunizations that include influenza, TDAP    Elevated cholesterol Assessment & Plan: Stable, continue low fat diet  Orders: -     Lipid panel  Anxiety and depression Assessment & Plan: Currently stable.  Continue follow-up with therapist.  Orders: -     CMP14+EGFR  Vitamin D deficiency -     VITAMIN D 25 Hydroxy (Vit-D Deficiency, Fractures)  Insulin resistance -     Insulin, random  COVID-19 vaccine administered Assessment & Plan: Covid 19 vaccine given in office observed for 15 minutes without any adverse reaction    Screening for STDs (sexually transmitted diseases) -     Hepatitis B surface antigen -     RPR  Class 3 severe obesity due to excess calories without serious comorbidity with body mass index (BMI) of 45.0 to 49.9 in adult [facility]) Assessment & Plan: She is encouraged to strive for BMI less than 30 to decrease cardiac risk. Advised to aim for at least 150 minutes of exercise per week.  Encouraged to park further when at the store, take stairs instead of elevators and to walk in place during commercials. Increase water intake to at least one gallon of water daily.    Sickle cell trait (HCC) -     Fructosamine  Diverticulitis Assessment & Plan: Will refer to GI and discussed foods to avoid flare  Orders: -     CBC with Differential/Platelet -     Ambulatory referral to Gastroenterology  Encounter for Papanicolaou smear of cervix Assessment & Plan: Referral made to GYN at patient request  Orders: -     Ambulatory referral to Obstetrics / Gynecology  Encounter for screening colonoscopy Assessment & Plan: According to USPTF Colorectal cancer Screening guidelines. Colonoscopy is recommended every 10 years,  starting at age 59 years. Will refer to GI for colon cancer screening.   Orders: -     Ambulatory referral to Gastroenterology  Encounter for screening mammogram for breast cancer Assessment & Plan: Pt instructed on Self Breast  Exam.According to ACOG guidelines Women aged 79 and older are recommended to get an annual mammogram.    Orders: -     3D Screening Mammogram, Left and Right; Future  Encounter for HIV (human immunodeficiency virus) test -     HIV Antibody (routine testing w rflx)  Encounter for hepatitis C screening test for low risk patient -     Hepatitis C antibody   Return for 1 year physical, 6 month chol check. Patient was given opportunity to ask questions. Patient verbalized understanding of the plan and was able to repeat key elements of the plan. All questions were answered to their satisfaction.   Arnette Felts, FNP  I, Arnette Felts, FNP, have reviewed all documentation for this visit. The documentation on 04/02/23 for the exam, diagnosis, procedures, and orders are all accurate and complete.

## 2023-04-03 LAB — RPR: RPR Ser Ql: NONREACTIVE

## 2023-04-03 LAB — CBC WITH DIFFERENTIAL/PLATELET
Basophils Absolute: 0 10*3/uL (ref 0.0–0.2)
Basos: 1 %
EOS (ABSOLUTE): 0.1 10*3/uL (ref 0.0–0.4)
Eos: 2 %
Hematocrit: 36 % (ref 34.0–46.6)
Hemoglobin: 11.5 g/dL (ref 11.1–15.9)
Immature Grans (Abs): 0 10*3/uL (ref 0.0–0.1)
Immature Granulocytes: 0 %
Lymphocytes Absolute: 2.1 10*3/uL (ref 0.7–3.1)
Lymphs: 34 %
MCH: 25.8 pg — ABNORMAL LOW (ref 26.6–33.0)
MCHC: 31.9 g/dL (ref 31.5–35.7)
MCV: 81 fL (ref 79–97)
Monocytes Absolute: 0.5 10*3/uL (ref 0.1–0.9)
Monocytes: 8 %
Neutrophils Absolute: 3.4 10*3/uL (ref 1.4–7.0)
Neutrophils: 55 %
Platelets: 281 10*3/uL (ref 150–450)
RBC: 4.46 x10E6/uL (ref 3.77–5.28)
RDW: 15.1 % (ref 11.7–15.4)
WBC: 6.1 10*3/uL (ref 3.4–10.8)

## 2023-04-03 LAB — LIPID PANEL
Chol/HDL Ratio: 3.5 ratio (ref 0.0–4.4)
Cholesterol, Total: 170 mg/dL (ref 100–199)
HDL: 48 mg/dL (ref >39)
LDL Chol Calc (NIH): 106 mg/dL — ABNORMAL HIGH (ref 0–99)
Triglycerides: 88 mg/dL (ref 0–149)
VLDL Cholesterol Cal: 16 mg/dL (ref 5–40)

## 2023-04-03 LAB — CMP14+EGFR
ALT: 14 IU/L (ref 0–32)
AST: 17 IU/L (ref 0–40)
Albumin: 4.1 g/dL (ref 3.9–4.9)
Alkaline Phosphatase: 60 IU/L (ref 44–121)
BUN/Creatinine Ratio: 17 (ref 9–23)
BUN: 11 mg/dL (ref 6–24)
Bilirubin Total: 0.4 mg/dL (ref 0.0–1.2)
CO2: 22 mmol/L (ref 20–29)
Calcium: 9.3 mg/dL (ref 8.7–10.2)
Chloride: 103 mmol/L (ref 96–106)
Creatinine, Ser: 0.66 mg/dL (ref 0.57–1.00)
Globulin, Total: 3.3 g/dL (ref 1.5–4.5)
Glucose: 82 mg/dL (ref 70–99)
Potassium: 4.1 mmol/L (ref 3.5–5.2)
Sodium: 138 mmol/L (ref 134–144)
Total Protein: 7.4 g/dL (ref 6.0–8.5)
eGFR: 109 mL/min/{1.73_m2} (ref >59)

## 2023-04-03 LAB — HIV ANTIBODY (ROUTINE TESTING W REFLEX): HIV Screen 4th Generation wRfx: NONREACTIVE

## 2023-04-03 LAB — INSULIN, RANDOM: INSULIN: 15.8 u[IU]/mL (ref 2.6–24.9)

## 2023-04-03 LAB — FRUCTOSAMINE: Fructosamine: 215 umol/L (ref 0–285)

## 2023-04-03 LAB — HEPATITIS C ANTIBODY: Hep C Virus Ab: NONREACTIVE

## 2023-04-03 LAB — VITAMIN D 25 HYDROXY (VIT D DEFICIENCY, FRACTURES): Vit D, 25-Hydroxy: 18.8 ng/mL — ABNORMAL LOW (ref 30.0–100.0)

## 2023-04-03 LAB — HEPATITIS B SURFACE ANTIGEN: Hepatitis B Surface Ag: NEGATIVE

## 2023-04-04 DIAGNOSIS — F4323 Adjustment disorder with mixed anxiety and depressed mood: Secondary | ICD-10-CM | POA: Diagnosis not present

## 2023-04-12 ENCOUNTER — Encounter: Payer: Self-pay | Admitting: Nurse Practitioner

## 2023-04-12 DIAGNOSIS — Z23 Encounter for immunization: Secondary | ICD-10-CM | POA: Insufficient documentation

## 2023-04-12 DIAGNOSIS — Z1231 Encounter for screening mammogram for malignant neoplasm of breast: Secondary | ICD-10-CM | POA: Insufficient documentation

## 2023-04-12 DIAGNOSIS — Z1211 Encounter for screening for malignant neoplasm of colon: Secondary | ICD-10-CM | POA: Insufficient documentation

## 2023-04-12 DIAGNOSIS — Z0000 Encounter for general adult medical examination without abnormal findings: Secondary | ICD-10-CM | POA: Insufficient documentation

## 2023-04-12 DIAGNOSIS — K5792 Diverticulitis of intestine, part unspecified, without perforation or abscess without bleeding: Secondary | ICD-10-CM

## 2023-04-12 DIAGNOSIS — Z124 Encounter for screening for malignant neoplasm of cervix: Secondary | ICD-10-CM | POA: Insufficient documentation

## 2023-04-12 DIAGNOSIS — E559 Vitamin D deficiency, unspecified: Secondary | ICD-10-CM | POA: Insufficient documentation

## 2023-04-12 DIAGNOSIS — E7800 Pure hypercholesterolemia, unspecified: Secondary | ICD-10-CM | POA: Insufficient documentation

## 2023-04-12 HISTORY — DX: Diverticulitis of intestine, part unspecified, without perforation or abscess without bleeding: K57.92

## 2023-04-12 MED ORDER — VITAMIN D (ERGOCALCIFEROL) 1.25 MG (50000 UNIT) PO CAPS: 50000.0000 [IU] | ORAL_CAPSULE | ORAL | 3 refills | Status: DC

## 2023-04-12 NOTE — Assessment & Plan Note (Signed)
Will refer to GI and discussed foods to avoid flare

## 2023-04-12 NOTE — Assessment & Plan Note (Signed)
Currently stable.  Continue follow-up with therapist.

## 2023-04-12 NOTE — Assessment & Plan Note (Signed)
Pt instructed on Self Breast Exam.According to ACOG guidelines Women aged 46 and older are recommended to get an annual mammogram.

## 2023-04-12 NOTE — Assessment & Plan Note (Signed)

## 2023-04-12 NOTE — Assessment & Plan Note (Signed)
Covid 19 vaccine given in office observed for 15 minutes without any adverse reaction  

## 2023-04-12 NOTE — Assessment & Plan Note (Signed)
She is encouraged to strive for BMI less than 30 to decrease cardiac risk. Advised to aim for at least 150 minutes of exercise per week.  Encouraged to park further when at the store, take stairs instead of elevators and to walk in place during commercials. Increase water intake to at least one gallon of water daily.

## 2023-04-12 NOTE — Assessment & Plan Note (Signed)
According to USPTF Colorectal cancer Screening guidelines. Colonoscopy is recommended every 10 years, starting at age 46 years. Will refer to GI for colon cancer screening.

## 2023-04-12 NOTE — Assessment & Plan Note (Signed)
Referral made to GYN at patient request

## 2023-04-12 NOTE — Assessment & Plan Note (Signed)
Stable, continue low fat diet

## 2023-04-15 DIAGNOSIS — F4323 Adjustment disorder with mixed anxiety and depressed mood: Secondary | ICD-10-CM | POA: Diagnosis not present

## 2023-05-02 DIAGNOSIS — F4323 Adjustment disorder with mixed anxiety and depressed mood: Secondary | ICD-10-CM | POA: Diagnosis not present

## 2023-05-16 ENCOUNTER — Ambulatory Visit: Payer: Medicaid Other | Admitting: Neurology

## 2023-05-28 IMAGING — MG MM DIGITAL SCREENING BILAT W/ TOMO AND CAD
8 series · 8 of 24 positions shown · non-contrast
Comparison: Previous exam(s).

CLINICAL DATA: Screening.

EXAM:
DIGITAL SCREENING BILATERAL MAMMOGRAM WITH TOMOSYNTHESIS AND CAD
TECHNIQUE: Bilateral screening digital craniocaudal and mediolateral oblique
mammograms were obtained. Bilateral screening digital breast
tomosynthesis was performed. The images were evaluated with
computer-aided detection.

[L CC synth-2D]
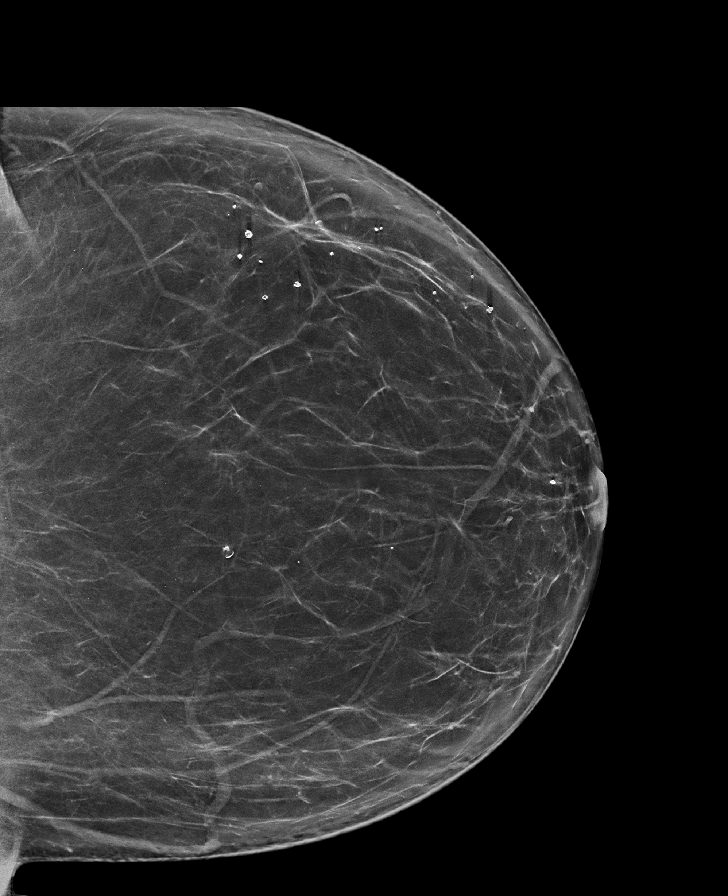

[L MLO synth-2D]
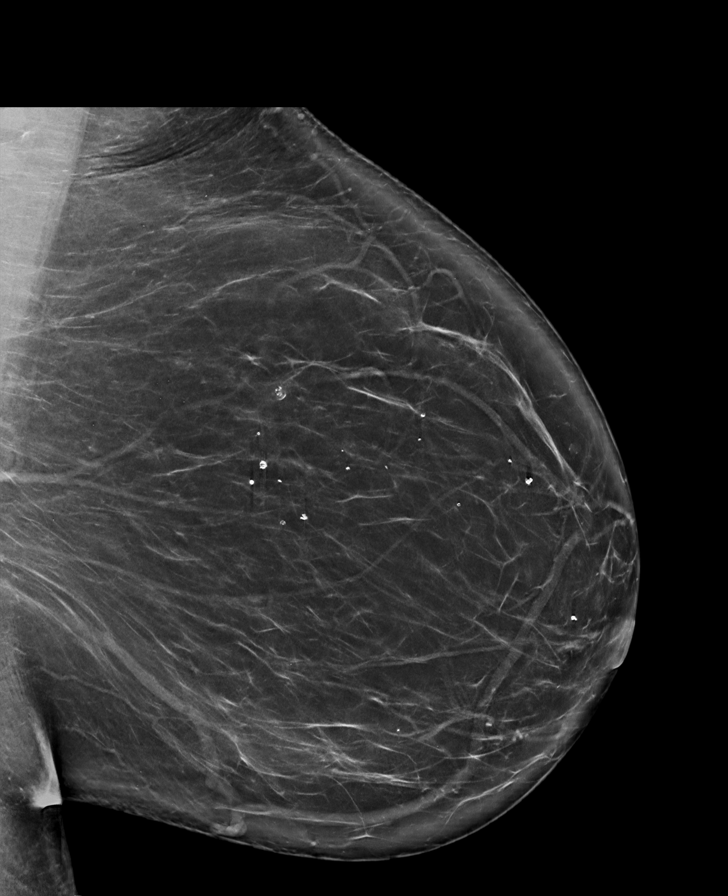

[R CC synth-2D]
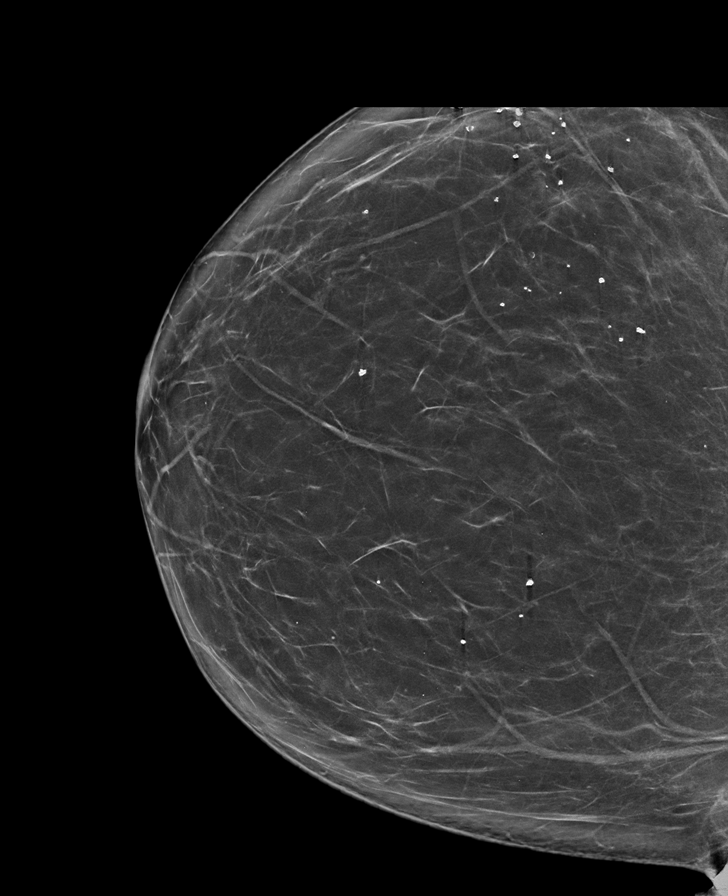

[R MLO synth-2D]
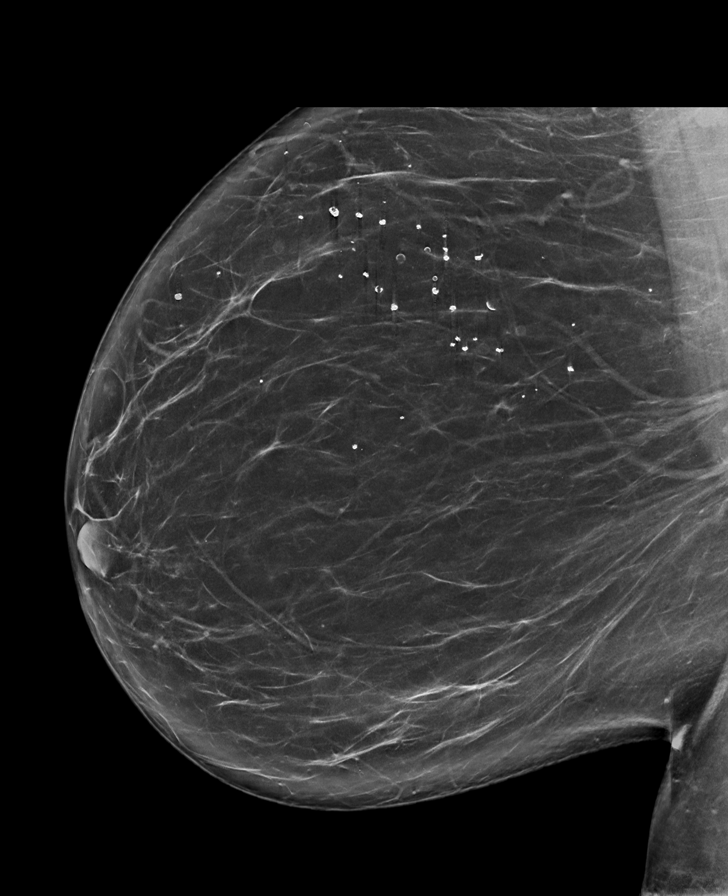

[L CC tomo · tomo slice 45/90.0]
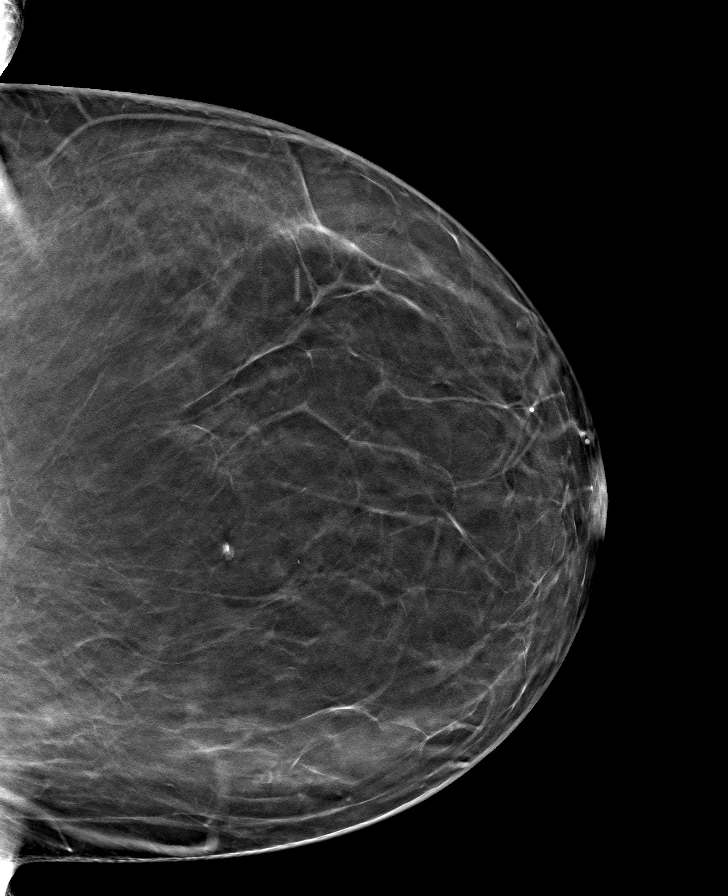

[R MLO tomo · tomo slice 51/102.0]
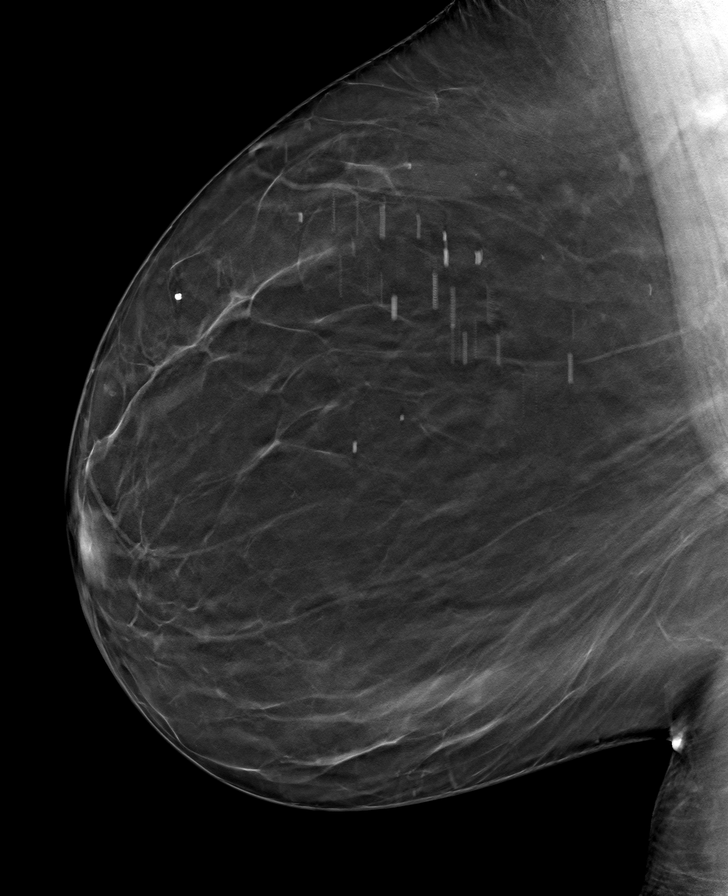

[L MLO tomo · tomo slice 49/97.0]
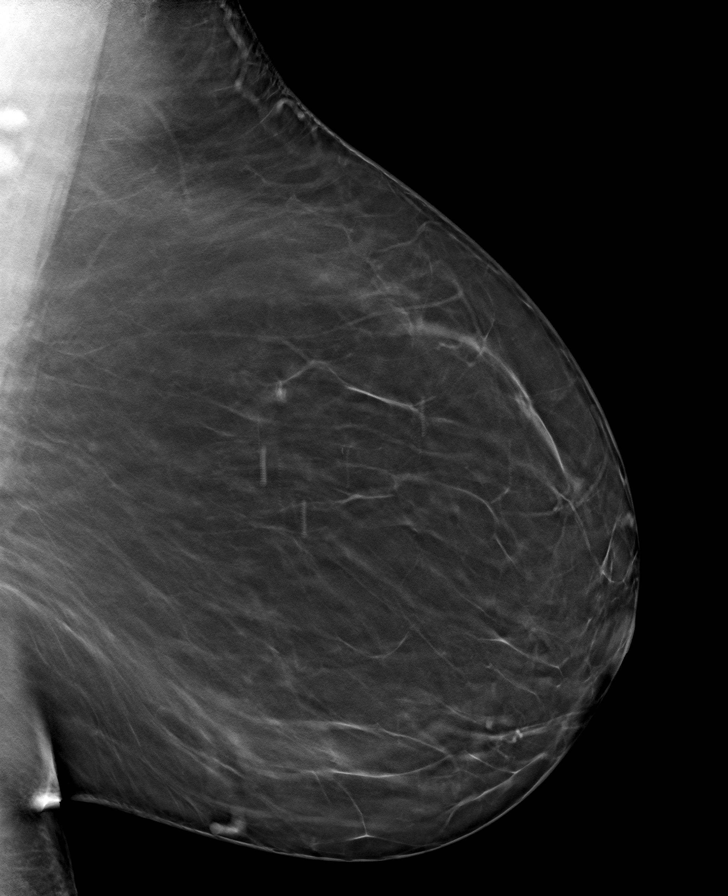

[R CC tomo · tomo slice 47/92.0]
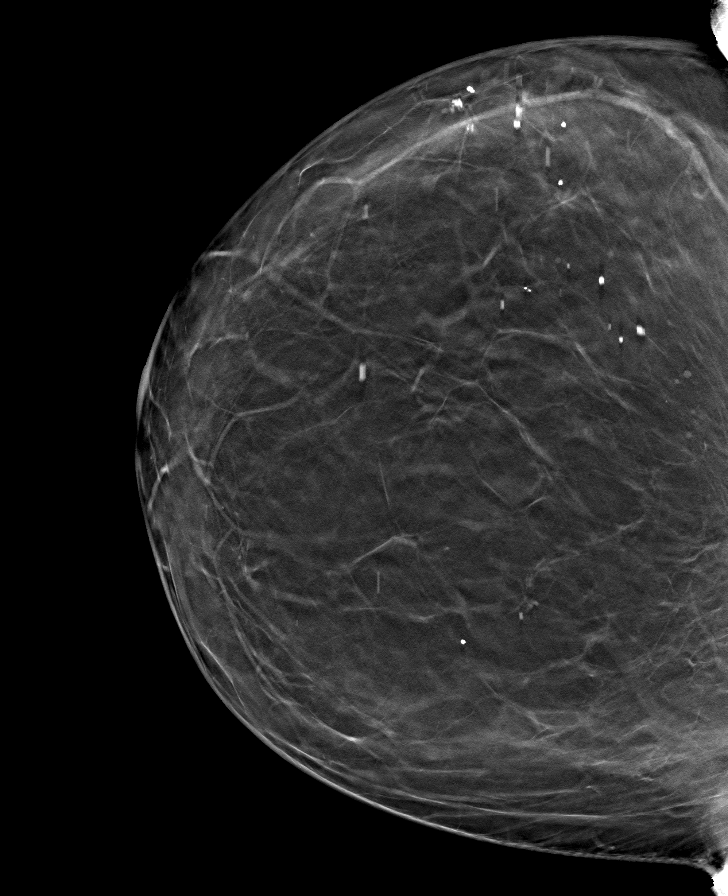

[8 of 24 positions shown; findings below may reference images not displayed]

ACR Breast Density Category b: There are scattered areas of
fibroglandular density.
FINDINGS: There are no findings suspicious for malignancy.
IMPRESSION: No mammographic evidence of malignancy. A result letter of this
screening mammogram will be mailed directly to the patient.

RECOMMENDATION:
Screening mammogram in one year. (Code:51-O-LD2)

BI-RADS CATEGORY  1: Negative.

## 2023-06-04 DIAGNOSIS — F4323 Adjustment disorder with mixed anxiety and depressed mood: Secondary | ICD-10-CM | POA: Diagnosis not present

## 2023-06-10 ENCOUNTER — Telehealth: Payer: Self-pay | Admitting: Psychiatry

## 2023-06-10 NOTE — Telephone Encounter (Signed)
LVM and sent mychart msg informing pt of need to reschedule 06/14/23 appt - MD out

## 2023-06-11 ENCOUNTER — Inpatient Hospital Stay: Admission: RE | Admit: 2023-06-11 | Payer: Medicaid Other | Source: Ambulatory Visit

## 2023-06-14 ENCOUNTER — Ambulatory Visit: Payer: Medicaid Other | Admitting: Psychiatry

## 2023-06-20 DIAGNOSIS — F4323 Adjustment disorder with mixed anxiety and depressed mood: Secondary | ICD-10-CM | POA: Diagnosis not present

## 2023-07-03 ENCOUNTER — Emergency Department (HOSPITAL_COMMUNITY): Payer: Medicaid Other

## 2023-07-03 ENCOUNTER — Other Ambulatory Visit: Payer: Self-pay

## 2023-07-03 ENCOUNTER — Emergency Department (HOSPITAL_COMMUNITY)
Admission: EM | Admit: 2023-07-03 | Discharge: 2023-07-03 | Disposition: A | Discharge status: Home / Self Care | Payer: Medicaid Other | Attending: Emergency Medicine | Admitting: Emergency Medicine

## 2023-07-03 DIAGNOSIS — Z91010 Allergy to peanuts: Secondary | ICD-10-CM | POA: Diagnosis not present

## 2023-07-03 DIAGNOSIS — M25461 Effusion, right knee: Secondary | ICD-10-CM | POA: Diagnosis not present

## 2023-07-03 DIAGNOSIS — M25551 Pain in right hip: Secondary | ICD-10-CM | POA: Insufficient documentation

## 2023-07-03 DIAGNOSIS — J45909 Unspecified asthma, uncomplicated: Secondary | ICD-10-CM | POA: Diagnosis not present

## 2023-07-03 DIAGNOSIS — M25561 Pain in right knee: Secondary | ICD-10-CM | POA: Insufficient documentation

## 2023-07-03 DIAGNOSIS — W01198A Fall on same level from slipping, tripping and stumbling with subsequent striking against other object, initial encounter: Secondary | ICD-10-CM | POA: Diagnosis not present

## 2023-07-03 DIAGNOSIS — W19XXXA Unspecified fall, initial encounter: Secondary | ICD-10-CM

## 2023-07-03 DIAGNOSIS — Z043 Encounter for examination and observation following other accident: Secondary | ICD-10-CM | POA: Diagnosis not present

## 2023-07-03 MED ORDER — METHOCARBAMOL 500 MG PO TABS: 500.0000 mg | ORAL_TABLET | Freq: Two times a day (BID) | ORAL | 0 refills | Status: DC

## 2023-07-03 MED ORDER — OXYCODONE-ACETAMINOPHEN 5-325 MG PO TABS
1.0000 | ORAL_TABLET | Freq: Once | ORAL | Status: AC
  Administered 2023-07-03: 1 via ORAL
  Filled 2023-07-03: qty 1

## 2023-07-03 MED ORDER — LIDOCAINE 5 % EX PTCH
1.0000 | MEDICATED_PATCH | CUTANEOUS | Status: DC
  Administered 2023-07-03: 1 via TRANSDERMAL
  Filled 2023-07-03: qty 1

## 2023-07-03 MED ORDER — KETOROLAC TROMETHAMINE 15 MG/ML IJ SOLN
15.0000 mg | Freq: Once | INTRAMUSCULAR | Status: AC
  Administered 2023-07-03: 15 mg via INTRAMUSCULAR
  Filled 2023-07-03: qty 1

## 2023-07-03 MED ORDER — NAPROXEN 500 MG PO TABS
500.0000 mg | ORAL_TABLET | Freq: Two times a day (BID) | ORAL | 0 refills | Status: AC
Start: 2023-07-03 — End: ?

## 2023-07-03 NOTE — ED Notes (Signed)
Awaiting patient from lobby

## 2023-07-03 NOTE — ED Provider Notes (Signed)
[place] EMERGENCY DEPARTMENT AT [facility] Provider Note   CSN: [id number] Arrival date & time: 07/03/23  0818     History {Add pertinent medical, surgical, social history, OB history to HPI:1} Chief Complaint  Patient presents with   Fall   Hip Pain   Leg Pain    Tricia Curry is a 47 y.o. female presents today for evaluation of hip pain and leg pain.  Patient reports that she was holding her granddaughter when she fell on the right side hitting her hip on the floor.  She denies hitting her head, LOC, nausea, vomiting, vision changes after the fall.  Patient is able to ambulate after the fall however she says she had to tiptoe.   Fall  Hip Pain  Leg Pain   Past Medical History:  Diagnosis Date   Anemia    Anxiety    Asthma    Back pain    Constipation    Depression    Fatigue    Food allergy, peanut    GERD (gastroesophageal reflux disease)    Insulin resistance 08/29/2020   Joint pain    Shortness of breath    Shortness of breath on exertion    Swelling of both lower extremities    Vitamin D deficiency    Past Surgical History:  Procedure Laterality Date   LAPAROSCOPIC TUBAL LIGATION Bilateral 01/11/2016   Procedure: LAPAROSCOPIC BILATERAL TUBAL LIGATION;  Surgeon: Gerald Leitz, MD;  Location: [facility];  Service: Gynecology;  Laterality: Bilateral;     Home Medications Prior to Admission medications   Medication Sig Start Date End Date Taking? Authorizing Provider  ACCU-CHEK GUIDE test strip USE UP TO FOUR TIMES DAILY AS NEEDED 11/02/20   Arnette Felts, FNP  Accu-Chek Softclix Lancets lancets USE UP TO FOUR TIMES DAILY AS DIRECTED 09/21/20   Dorothyann Peng, MD  albuterol (VENTOLIN HFA) 108 (90 Base) MCG/ACT inhaler INHALE 1 TO 2 PUFFS INTO THE LUNGS EVERY 6 HOURS AS NEEDED FOR WHEEZING OR SHORTNESS OF BREATH 09/14/19   Rodriguez-Southworth, Nettie Elm, PA-C  blood glucose meter kit and supplies KIT Dispense based on patient and insurance preference. Use  up to four times daily as directed. (FOR ICD-9 250.00, 250.01). 08/29/20   Arnette Felts, FNP  diclofenac Sodium (VOLTAREN) 1 % GEL Apply 2 g topically 4 (four) times daily. 03/26/21   Placido Sou, PA-C  diphenhydrAMINE (BENADRYL) 25 mg capsule Take 25 mg by mouth daily as needed for allergies.    [provider]  escitalopram (LEXAPRO) 10 MG tablet Take 1 tablet (10 mg total) by mouth daily. Patient not taking: Reported on 04/02/2023 01/01/22 04/02/23  Arnette Felts, FNP  glucose blood (ACCU-CHEK GUIDE) test strip USE UP TO FOUR TIMES DAILY AS NEEDED 11/07/20   Arnette Felts, FNP  olopatadine (PATANOL) 0.1 % ophthalmic solution Place 1 drop into both eyes daily as needed for allergies. 01/01/22   Arnette Felts, FNP  ondansetron (ZOFRAN-ODT) 4 MG disintegrating tablet Take 1 tablet (4 mg total) by mouth every 8 (eight) hours as needed for nausea or vomiting. 03/15/23   Laurence Spates, MD  valACYclovir (VALTREX) 500 MG tablet TAKE 1 TABLET(500 MG) BY MOUTH TWICE DAILY 01/01/22   Arnette Felts, FNP  Vitamin D, Ergocalciferol, (DRISDOL) 1.25 MG (50000 UNIT) CAPS capsule Take 1 capsule (50,000 Units total) by mouth every 7 (seven) days. 04/12/23   Arnette Felts, FNP      Allergies    Peanut-containing drug products    Review of  Systems   Review of Systems Negative except as per HPI.  Physical Exam Updated Vital Signs BP (!) 139/97 (BP Location: Right Arm)   Pulse 71   Temp 97.9 F (36.6 C) (Oral)   Resp 16   Ht 5\' 6"  (1.676 m)   Wt 127.9 kg   LMP 06/27/2023   SpO2 100%   BMI 45.52 kg/m  Physical Exam Vitals and nursing note reviewed.  Constitutional:      Appearance: Normal appearance.  HENT:     Head: Normocephalic and atraumatic.     Mouth/Throat:     Mouth: Mucous membranes are moist.  Eyes:     General: No scleral icterus. Cardiovascular:     Rate and Rhythm: Normal rate and regular rhythm.     Pulses: Normal pulses.     Heart sounds: Normal heart sounds.   Pulmonary:     Effort: Pulmonary effort is normal.     Breath sounds: Normal breath sounds.  Abdominal:     General: Abdomen is flat.     Palpations: Abdomen is soft.     Tenderness: There is no abdominal tenderness.  Musculoskeletal:        General: No deformity.     Comments: Tenderness to palpation to right-sided hip.  Skin:    General: Skin is warm.     Findings: No rash.  Neurological:     General: No focal deficit present.     Mental Status: She is alert.  Psychiatric:        Mood and Affect: Mood normal.     ED Results / Procedures / Treatments   Labs (all labs ordered are listed, but only abnormal results are displayed) Labs Reviewed - No data to display  EKG None  Radiology DG Hip Unilat W or Wo Pelvis 2-3 Views Right  Result Date: 07/03/2023 CLINICAL DATA:  Pain after fall EXAM: DG HIP (WITH OR WITHOUT PELVIS) 3V RIGHT COMPARISON:  None Available. FINDINGS: No fracture or dislocation. Slight joint space loss and osteophytes along the sacroiliac joints. Hyperostosis. Preserved hip joints. IMPRESSION: Mild degenerative changes. Electronically Signed   By: Karen Kays M.D.   On: 07/03/2023 10:07    Procedures Procedures  {Document cardiac monitor, telemetry assessment procedure when appropriate:1}  Medications Ordered in ED Medications  lidocaine (LIDODERM) 5 % 1 patch (has no administration in time range)  oxyCODONE-acetaminophen (PERCOCET/ROXICET) 5-325 MG per tablet 1 tablet (1 tablet Oral Given 07/03/23 1617)  ketorolac (TORADOL) 15 MG/ML injection 15 mg (15 mg Intramuscular Given 07/03/23 1617)    ED Course/ Medical Decision Making/ A&P   {   Click here for ABCD2, HEART and other calculatorsREFRESH Note before signing :1}                              Medical Decision Making Amount and/or Complexity of Data Reviewed Radiology: ordered.  Risk Prescription drug management.   ***  {Document critical care time when appropriate:1} {Document review  of labs and clinical decision tools ie heart score, Chads2Vasc2 etc:1}  {Document your independent review of radiology images, and any outside records:1} {Document your discussion with family members, caretakers, and with consultants:1} {Document social determinants of health affecting pt's care:1} {Document your decision making why or why not admission, treatments were needed:1} Final Clinical Impression(s) / ED Diagnoses Final diagnoses:  None    Rx / DC Orders ED Discharge Orders     None

## 2023-07-03 NOTE — ED Notes (Signed)
Patient verbalizes understanding of discharge instructions. Opportunity for questioning and answers were provided. Armband removed by staff, pt discharged from ED. Pt ambulatory to ED waiting room with steady gait.  

## 2023-07-03 NOTE — ED Triage Notes (Signed)
Pt. Stated, I fell this morning holding my granddaughter. I can't hardly walk and putting pressure on it is terrible.

## 2023-07-03 NOTE — ED Provider Triage Note (Signed)
Emergency Medicine Provider Triage Evaluation Note  Tricia Curry , a 47 y.o. female  was evaluated in triage.  Pt complains of right hip pain.  Pt reports she fell this am and landed on her right hip   Review of Systems  Positive: Pain with standing, difficult to walk Negative: No head injury  Physical Exam  BP (!) 130/94 (BP Location: Right Arm)   Pulse 80   Temp 98.6 F (37 C) (Oral)   Resp 16   Ht 5\' 6"  (1.676 m)   Wt 127.9 kg   LMP 06/27/2023   SpO2 98%   BMI 45.52 kg/m  Gen:   Awake, no distress   Resp:  Normal effort  MSK:   Tender right hip to palpation,  pain with movement  Other:    Medical Decision Making  Medically screening exam initiated at 9:24 AM.  Appropriate orders placed.  Othell Fornash was informed that the remainder of the evaluation will be completed by another provider, this initial triage assessment does not replace that evaluation, and the importance of remaining in the ED until their evaluation is complete.     Elson Areas, [place] 07/03/23 [phone]

## 2023-07-03 NOTE — Discharge Instructions (Addendum)
Please take tylenol/ibuprofen/naproxen for pain. I recommend close follow-up with orthopedics for reevaluation.  Please do not hesitate to return to emergency department if worrisome signs symptoms we discussed become apparent.

## 2023-07-04 DIAGNOSIS — F4323 Adjustment disorder with mixed anxiety and depressed mood: Secondary | ICD-10-CM | POA: Diagnosis not present

## 2023-07-05 DIAGNOSIS — S8391XA Sprain of unspecified site of right knee, initial encounter: Secondary | ICD-10-CM | POA: Diagnosis not present

## 2023-07-18 DIAGNOSIS — F4323 Adjustment disorder with mixed anxiety and depressed mood: Secondary | ICD-10-CM | POA: Diagnosis not present

## 2023-07-26 DIAGNOSIS — M25561 Pain in right knee: Secondary | ICD-10-CM | POA: Diagnosis not present

## 2023-08-01 DIAGNOSIS — F4323 Adjustment disorder with mixed anxiety and depressed mood: Secondary | ICD-10-CM | POA: Diagnosis not present

## 2023-08-29 DIAGNOSIS — F4323 Adjustment disorder with mixed anxiety and depressed mood: Secondary | ICD-10-CM | POA: Diagnosis not present

## 2023-09-18 ENCOUNTER — Ambulatory Visit: Payer: Medicaid Other | Admitting: Nurse Practitioner

## 2023-09-18 VITALS — BP 120/80 | HR 78 | Temp 98.9°F | Ht 66.0 in | Wt 274.4 lb

## 2023-09-18 DIAGNOSIS — W19XXXA Unspecified fall, initial encounter: Secondary | ICD-10-CM

## 2023-09-18 DIAGNOSIS — Z6841 Body Mass Index (BMI) 40.0 and over, adult: Secondary | ICD-10-CM

## 2023-09-18 DIAGNOSIS — R051 Acute cough: Secondary | ICD-10-CM | POA: Insufficient documentation

## 2023-09-18 DIAGNOSIS — S728X1D Other fracture of right femur, subsequent encounter for closed fracture with routine healing: Secondary | ICD-10-CM

## 2023-09-18 DIAGNOSIS — Z2821 Immunization not carried out because of patient refusal: Secondary | ICD-10-CM | POA: Diagnosis not present

## 2023-09-18 DIAGNOSIS — E66813 Obesity, class 3: Secondary | ICD-10-CM

## 2023-09-18 DIAGNOSIS — E559 Vitamin D deficiency, unspecified: Secondary | ICD-10-CM | POA: Diagnosis not present

## 2023-09-18 DIAGNOSIS — S7291XD Unspecified fracture of right femur, subsequent encounter for closed fracture with routine healing: Secondary | ICD-10-CM

## 2023-09-18 DIAGNOSIS — R0981 Nasal congestion: Secondary | ICD-10-CM | POA: Insufficient documentation

## 2023-09-18 DIAGNOSIS — W19XXXD Unspecified fall, subsequent encounter: Secondary | ICD-10-CM | POA: Diagnosis not present

## 2023-09-18 DIAGNOSIS — E7800 Pure hypercholesterolemia, unspecified: Secondary | ICD-10-CM | POA: Diagnosis not present

## 2023-09-18 HISTORY — DX: Unspecified fall, initial encounter: W19.XXXA

## 2023-09-18 HISTORY — DX: Unspecified fracture of right femur, subsequent encounter for closed fracture with routine healing: S72.91XD

## 2023-09-18 NOTE — Assessment & Plan Note (Signed)

## 2023-09-18 NOTE — Assessment & Plan Note (Signed)
 She is encouraged to strive for BMI less than 30 to decrease cardiac risk. Advised to aim for at least 150 minutes of exercise per week.

## 2023-09-18 NOTE — Assessment & Plan Note (Signed)
Stable, continue low fat diet

## 2023-09-18 NOTE — Assessment & Plan Note (Signed)
This is likely related to allergies

## 2023-09-18 NOTE — Progress Notes (Signed)
Madelaine Bhat, CMA,acting as a Neurosurgeon for Arnette Felts, FNP.,have documented all relevant documentation on the behalf of Arnette Felts, FNP,as directed by  Arnette Felts, FNP while in the presence of Arnette Felts, FNP.  Subjective:  Patient ID: Tricia Curry , female    DOB: [birth year] , 47 y.o.   MRN: [id number]  Chief Complaint  Patient presents with   Hyperlipidemia    HPI  Patient presents today for a chol follow up, Patient reports compliance with medication. Patient denies any chest pain, SOB, or headaches. Patient reports she can't get rid of her nasal congestion and cough. She reports it started before thanksgiving. LMP - 2 months ago. She has a tubal ligation and has not been sexually active.   Hyperlipidemia This is a chronic problem. The current episode started more than 1 year ago. The problem is controlled. Recent lipid tests were reviewed and are normal. There are no known factors aggravating her hyperlipidemia. Pertinent negatives include no shortness of breath. Compliance problems include adherence to diet.  Risk factors for coronary artery disease include obesity and a sedentary lifestyle.     Past Medical History:  Diagnosis Date   Anemia    Anxiety    Asthma    Back pain    Constipation    Depression    Fatigue    Food allergy, peanut    GERD (gastroesophageal reflux disease)    Insulin resistance 08/29/2020   Joint pain    Shortness of breath    Shortness of breath on exertion    Swelling of both lower extremities    Vitamin D deficiency      Family History  Problem Relation Age of Onset   Stroke Maternal Aunt    Breast cancer Sister    Hypertension Mother    Depression Mother    Anxiety disorder Mother    Sleep apnea Mother      Current Outpatient Medications:    ACCU-CHEK GUIDE test strip, USE UP TO FOUR TIMES DAILY AS NEEDED, Disp: 100 strip, Rfl: 2   Accu-Chek Softclix Lancets lancets, USE UP TO FOUR TIMES DAILY AS DIRECTED, Disp: 100 each,  Rfl: 2   albuterol (VENTOLIN HFA) 108 (90 Base) MCG/ACT inhaler, INHALE 1 TO 2 PUFFS INTO THE LUNGS EVERY 6 HOURS AS NEEDED FOR WHEEZING OR SHORTNESS OF BREATH, Disp: 18 g, Rfl: 2   blood glucose meter kit and supplies KIT, Dispense based on patient and insurance preference. Use up to four times daily as directed. (FOR ICD-9 250.00, 250.01)., Disp: 1 each, Rfl: 0   diclofenac Sodium (VOLTAREN) 1 % GEL, Apply 2 g topically 4 (four) times daily., Disp: 50 g, Rfl: 1   diphenhydrAMINE (BENADRYL) 25 mg capsule, Take 25 mg by mouth daily as needed for allergies., Disp: , Rfl:    glucose blood (ACCU-CHEK GUIDE) test strip, USE UP TO FOUR TIMES DAILY AS NEEDED, Disp: 100 strip, Rfl: 2   methocarbamol (ROBAXIN) 500 MG tablet, Take 1 tablet (500 mg total) by mouth 2 (two) times daily., Disp: 20 tablet, Rfl: 0   naproxen (NAPROSYN) 500 MG tablet, Take 1 tablet (500 mg total) by mouth 2 (two) times daily., Disp: 30 tablet, Rfl: 0   olopatadine (PATANOL) 0.1 % ophthalmic solution, Place 1 drop into both eyes daily as needed for allergies., Disp: 5 mL, Rfl: 3   ondansetron (ZOFRAN-ODT) 4 MG disintegrating tablet, Take 1 tablet (4 mg total) by mouth every 8 (eight) hours as needed for nausea or  vomiting., Disp: 8 tablet, Rfl: 0   valACYclovir (VALTREX) 500 MG tablet, TAKE 1 TABLET(500 MG) BY MOUTH TWICE DAILY, Disp: 180 tablet, Rfl: 3   Vitamin D, Ergocalciferol, (DRISDOL) 1.25 MG (50000 UNIT) CAPS capsule, Take 1 capsule (50,000 Units total) by mouth every 7 (seven) days., Disp: 4 capsule, Rfl: 3   Allergies  Allergen Reactions   Peanut-Containing Drug Products Anaphylaxis     Review of Systems  Constitutional: Negative.   HENT:  Positive for rhinorrhea.   Eyes: Negative.   Respiratory:  Positive for cough. Negative for shortness of breath.   Cardiovascular: Negative.   Gastrointestinal: Negative.   Neurological: Negative.   Psychiatric/Behavioral: Negative.  Positive for depression.      Today's  Vitals   09/18/23 0836  BP: 120/80  Pulse: 78  Temp: 98.9 F (37.2 C)  TempSrc: Oral  Weight: 274 lb 6.4 oz (124.5 kg)  Height: 5\' 6"  (1.676 m)  PainSc: 0-No pain   Body mass index is 44.29 kg/m.  Wt Readings from Last 3 Encounters:  09/18/23 274 lb 6.4 oz (124.5 kg)  07/03/23 282 lb (127.9 kg)  04/02/23 279 lb 3.2 oz (126.6 kg)     Objective:  Physical Exam Vitals reviewed.  Constitutional:      General: She is not in acute distress.    Appearance: Normal appearance. She is obese.  Cardiovascular:     Rate and Rhythm: Normal rate and regular rhythm.     Pulses: Normal pulses.     Heart sounds: Normal heart sounds. No murmur heard. Pulmonary:     Effort: Pulmonary effort is normal. No respiratory distress.     Breath sounds: Normal breath sounds. No wheezing.  Skin:    General: Skin is warm and dry.     Capillary Refill: Capillary refill takes less than 2 seconds.  Neurological:     General: No focal deficit present.     Mental Status: She is alert and oriented to person, place, and time.     Cranial Nerves: No cranial nerve deficit.     Motor: No weakness.  Psychiatric:        Mood and Affect: Mood normal.        Behavior: Behavior normal.        Thought Content: Thought content normal.        Judgment: Judgment normal.         Assessment And Plan:  Elevated cholesterol Assessment & Plan: Stable, continue low fat diet  Orders: -     Lipid panel  Vitamin D deficiency Assessment & Plan: Will check vitamin D level and supplement as needed.    Also encouraged to spend 15 minutes in the sun daily.    Orders: -     VITAMIN D 25 Hydroxy (Vit-D Deficiency, Fractures)  Acute cough Assessment & Plan: This is likely related to allergies   Nasal congestion Assessment & Plan: Samples of [name] AD given if she likes it will send a Rx    COVID-19 vaccination declined Assessment & Plan: Declines covid 19 vaccine. Discussed risk of covid 64 and if she  changes her mind about the vaccine to call the office. Education has been provided regarding the importance of this vaccine but patient still declined. Advised may receive this vaccine at local pharmacy or Health Dept.or vaccine clinic. Aware to provide a copy of the vaccination record if obtained from local pharmacy or Health Dept.  Encouraged to take multivitamin, vitamin d, vitamin c and  zinc to increase immune system. Aware can call office if would like to have vaccine here at office. Verbalized acceptance and understanding.    Influenza vaccination declined Assessment & Plan: Patient declined influenza vaccination at this time. Patient is aware that influenza vaccine prevents illness in 70% of healthy people, and reduces hospitalizations to 30-70% in elderly. This vaccine is recommended annually. Education has been provided regarding the importance of this vaccine but patient still declined. Advised may receive this vaccine at local pharmacy or Health Dept.or vaccine clinic. Aware to provide a copy of the vaccination record if obtained from local pharmacy or Health Dept.  Pt is willing to accept risk associated with refusing vaccination.    Fall, subsequent encounter Assessment & Plan: While outside on slippery porch.    Other closed fracture of right femur with routine healing, unspecified portion of femur, subsequent encounter Assessment & Plan: This was found as a possible old fracture after having a fall. She wore a knee brace for a few weeks and is now better   Class 3 severe obesity due to excess calories without serious comorbidity with body mass index (BMI) of 40.0 to 44.9 in adult [facility]) Assessment & Plan: She is encouraged to strive for BMI less than 30 to decrease cardiac risk. Advised to aim for at least 150 minutes of exercise per week.      Return for keep same next.  Patient was given opportunity to ask questions. Patient verbalized understanding of the plan and was  able to repeat key elements of the plan. All questions were answered to their satisfaction.    Jeanell Sparrow, FNP, have reviewed all documentation for this visit. The documentation on 09/18/23 for the exam, diagnosis, procedures, and orders are all accurate and complete.   IF YOU HAVE BEEN REFERRED TO A SPECIALIST, IT MAY TAKE 1-2 WEEKS TO SCHEDULE/PROCESS THE REFERRAL. IF YOU HAVE NOT HEARD FROM US/SPECIALIST IN TWO WEEKS, PLEASE GIVE [name] A CALL AT [phone].

## 2023-09-18 NOTE — Assessment & Plan Note (Signed)
This was found as a possible old fracture after having a fall. She wore a knee brace for a few weeks and is now better

## 2023-09-18 NOTE — Assessment & Plan Note (Signed)
Will check vitamin D level and supplement as needed.    Also encouraged to spend 15 minutes in the sun daily.   

## 2023-09-18 NOTE — Assessment & Plan Note (Signed)

## 2023-09-18 NOTE — Assessment & Plan Note (Signed)
While outside on slippery porch.

## 2023-09-18 NOTE — Assessment & Plan Note (Signed)
Samples of [name] AD given if she likes it will send a Rx

## 2023-09-19 ENCOUNTER — Other Ambulatory Visit: Payer: Self-pay | Admitting: Nurse Practitioner

## 2023-09-19 DIAGNOSIS — F4323 Adjustment disorder with mixed anxiety and depressed mood: Secondary | ICD-10-CM | POA: Diagnosis not present

## 2023-09-19 DIAGNOSIS — E559 Vitamin D deficiency, unspecified: Secondary | ICD-10-CM

## 2023-09-19 LAB — LIPID PANEL
Chol/HDL Ratio: 3.6 {ratio} (ref 0.0–4.4)
Cholesterol, Total: 174 mg/dL (ref 100–199)
HDL: 48 mg/dL (ref >39)
LDL Chol Calc (NIH): 112 mg/dL — ABNORMAL HIGH (ref 0–99)
Triglycerides: 72 mg/dL (ref 0–149)
VLDL Cholesterol Cal: 14 mg/dL (ref 5–40)

## 2023-09-19 LAB — VITAMIN D 25 HYDROXY (VIT D DEFICIENCY, FRACTURES): Vit D, 25-Hydroxy: 15.4 ng/mL — ABNORMAL LOW (ref 30.0–100.0)

## 2023-09-19 MED ORDER — VITAMIN D (ERGOCALCIFEROL) 1.25 MG (50000 UNIT) PO CAPS
50000.0000 [IU] | ORAL_CAPSULE | ORAL | 1 refills | Status: AC
Start: 2023-09-19 — End: ?

## 2023-09-20 ENCOUNTER — Encounter: Payer: Self-pay | Admitting: Nurse Practitioner

## 2023-09-23 ENCOUNTER — Other Ambulatory Visit: Payer: Self-pay

## 2023-09-23 NOTE — Telephone Encounter (Signed)
Yes, send in 30 tabs with 1 refill

## 2023-09-24 ENCOUNTER — Other Ambulatory Visit: Payer: Self-pay

## 2023-09-24 MED ORDER — NOREL AD 4-10-325 MG PO TABS: 1.0000 | ORAL_TABLET | Freq: Every day | ORAL | 1 refills | Status: AC | PRN

## 2023-10-17 DIAGNOSIS — F4323 Adjustment disorder with mixed anxiety and depressed mood: Secondary | ICD-10-CM | POA: Diagnosis not present

## 2023-10-31 DIAGNOSIS — F4323 Adjustment disorder with mixed anxiety and depressed mood: Secondary | ICD-10-CM | POA: Diagnosis not present

## 2023-11-14 DIAGNOSIS — F4323 Adjustment disorder with mixed anxiety and depressed mood: Secondary | ICD-10-CM | POA: Diagnosis not present

## 2023-11-20 ENCOUNTER — Encounter: Payer: Self-pay | Admitting: Nurse Practitioner

## 2023-11-25 ENCOUNTER — Ambulatory Visit: Payer: Self-pay | Admitting: Nurse Practitioner

## 2023-11-28 DIAGNOSIS — F4323 Adjustment disorder with mixed anxiety and depressed mood: Secondary | ICD-10-CM | POA: Diagnosis not present

## 2023-11-29 DIAGNOSIS — J4521 Mild intermittent asthma with (acute) exacerbation: Secondary | ICD-10-CM | POA: Diagnosis not present

## 2023-12-05 ENCOUNTER — Ambulatory Visit: Payer: Medicaid Other | Admitting: Nurse Practitioner

## 2023-12-12 DIAGNOSIS — F4323 Adjustment disorder with mixed anxiety and depressed mood: Secondary | ICD-10-CM | POA: Diagnosis not present

## 2023-12-26 DIAGNOSIS — F4323 Adjustment disorder with mixed anxiety and depressed mood: Secondary | ICD-10-CM | POA: Diagnosis not present

## 2023-12-31 ENCOUNTER — Encounter: Payer: Self-pay | Admitting: Nurse Practitioner

## 2023-12-31 ENCOUNTER — Telehealth (INDEPENDENT_AMBULATORY_CARE_PROVIDER_SITE_OTHER): Admitting: Nurse Practitioner

## 2023-12-31 ENCOUNTER — Telehealth

## 2023-12-31 ENCOUNTER — Ambulatory Visit: Payer: Self-pay | Admitting: Nurse Practitioner

## 2023-12-31 VITALS — Temp 100.0°F

## 2023-12-31 DIAGNOSIS — Z20828 Contact with and (suspected) exposure to other viral communicable diseases: Secondary | ICD-10-CM | POA: Diagnosis not present

## 2023-12-31 DIAGNOSIS — J069 Acute upper respiratory infection, unspecified: Secondary | ICD-10-CM

## 2023-12-31 MED ORDER — OSELTAMIVIR PHOSPHATE 75 MG PO CAPS
75.0000 mg | ORAL_CAPSULE | Freq: Two times a day (BID) | ORAL | 0 refills | Status: AC
Start: 2023-12-31 — End: 2024-01-05

## 2023-12-31 MED ORDER — BENZONATATE 100 MG PO CAPS: 100.0000 mg | ORAL_CAPSULE | Freq: Four times a day (QID) | ORAL | 1 refills | Status: AC | PRN

## 2023-12-31 NOTE — Telephone Encounter (Signed)
  Chief Complaint: sinus pressure Symptoms: sinus pressure, chills, low grade fever, cough, scratchy throat, runny nose Frequency: began yesterday Pertinent Negatives: Patient denies CP, SOB, NVD Disposition: [] ED /[x] Urgent Care (no appt availability in office) / [] Appointment(In office/virtual)/ []  [facility]/ [] Home Care/ [] Refused Recommended Disposition /[] [facility]/ []  Follow-up with PCP Additional Notes: Patient calls reporting sinus pressure that began yesterday. Patient reports she has also had contact with family member with flu symptoms. States she has taken OTC medication with minimal relief, reports pain is 10/10. Per protocol, patient to be evaluated within 4 hours. First available appointment with PCP is outside of guideline. Patient scheduled VV for today at 1615 at patient request. Care advice reviewed, patient verbalized understanding and denies further questions at this time. Alerting PCP for review.    Reason for Disposition  [1] SEVERE pain AND [2] not improved 2 hours after pain medicine  Answer Assessment - Initial Assessment Questions 1. LOCATION: "Where does it hurt?"      Behind eyes in forehead 2. ONSET: "When did the sinus pain start?"  (e.g., hours, days)      yesterday 3. SEVERITY: "How bad is the pain?"   (Scale 1-10; mild, moderate or severe)   - MILD (1-3): doesn't interfere with normal activities    - MODERATE (4-7): interferes with normal activities (e.g., work or school) or awakens from sleep   - SEVERE (8-10): excruciating pain and patient unable to do any normal activities        10/10 4. RECURRENT SYMPTOM: "Have you ever had sinus problems before?" If Yes, ask: "When was the last time?" and "What happened that time?"      Yes, seasonal 5. NASAL CONGESTION: "Is the nose blocked?" If Yes, ask: "Can you open it or must you breathe through your mouth?"     Stuffy and runny 6. NASAL DISCHARGE: "Do you have discharge from your  nose?" If so ask, "What color?"     Yes, clear 7. FEVER: "Do you have a fever?" If Yes, ask: "What is it, how was it measured, and when did it start?"      Warm this morning, has not checked. 8. OTHER SYMPTOMS: "Do you have any other symptoms?" (e.g., sore throat, cough, earache, difficulty breathing)     Cough, scratchy throat, possible fever, runny nose 9. PREGNANCY: "Is there any chance you are pregnant?" "When was your last menstrual period?"     NA  Protocols used: Sinus Pain or Congestion-A-AH

## 2023-12-31 NOTE — Progress Notes (Signed)
 Virtual Visit via Video Note  Tricia Curry, CMA,acting as a scribe for Tricia Felts, FNP.,have documented all relevant documentation on the behalf of Tricia Felts, FNP,as directed by  Tricia Felts, FNP while in the presence of Tricia Felts, FNP.  I connected with Tricia Curry on 01/12/24 at  3:00 PM EDT by a video enabled telemedicine application and verified that I am speaking with the correct person using two identifiers.  Patient Location: Home Provider Location: Office/Clinic  I discussed the limitations, risks, security, and privacy concerns of performing an evaluation and management service by video and the availability of in person appointments. I also discussed with the patient that there may be a patient responsible charge related to this service. The patient expressed understanding and agreed to proceed.  Subjective: PCP: Tricia Felts, FNP  No chief complaint on file.  Patient presents today for exposure to the flu, patient reports having body aches, chills, sore throat, headaches, cough (small amount of mucous), shortness of breath and runny nose starting yesterday. She attempted to go to work today. She has taken tylenol for her headache. She is able to eat and drink.   Patients granddaughter was diagnosed with the flu.       ROS: Per HPI  Current Outpatient Medications:    benzonatate (TESSALON PERLES) 100 MG capsule, Take 1 capsule (100 mg total) by mouth every 6 (six) hours as needed., Disp: 30 capsule, Rfl: 1   ACCU-CHEK GUIDE test strip, USE UP TO FOUR TIMES DAILY AS NEEDED, Disp: 100 strip, Rfl: 2   Accu-Chek Softclix Lancets lancets, USE UP TO FOUR TIMES DAILY AS DIRECTED, Disp: 100 each, Rfl: 2   albuterol (VENTOLIN HFA) 108 (90 Base) MCG/ACT inhaler, INHALE 1 TO 2 PUFFS INTO THE LUNGS EVERY 6 HOURS AS NEEDED FOR WHEEZING OR SHORTNESS OF BREATH, Disp: 18 g, Rfl: 2   blood glucose meter kit and supplies KIT, Dispense based on patient and insurance  preference. Use up to four times daily as directed. (FOR ICD-9 250.00, 250.01)., Disp: 1 each, Rfl: 0   Chlorphen-PE-Acetaminophen ([name] AD) 4-10-325 MG TABS, Take 1 tablet by mouth daily as needed., Disp: 30 tablet, Rfl: 1   diclofenac Sodium (VOLTAREN) 1 % GEL, Apply 2 g topically 4 (four) times daily., Disp: 50 g, Rfl: 1   diphenhydrAMINE (BENADRYL) 25 mg capsule, Take 25 mg by mouth daily as needed for allergies., Disp: , Rfl:    glucose blood (ACCU-CHEK GUIDE) test strip, USE UP TO FOUR TIMES DAILY AS NEEDED, Disp: 100 strip, Rfl: 2   methocarbamol (ROBAXIN) 500 MG tablet, Take 1 tablet (500 mg total) by mouth 2 (two) times daily., Disp: 20 tablet, Rfl: 0   naproxen (NAPROSYN) 500 MG tablet, Take 1 tablet (500 mg total) by mouth 2 (two) times daily., Disp: 30 tablet, Rfl: 0   olopatadine (PATANOL) 0.1 % ophthalmic solution, Place 1 drop into both eyes daily as needed for allergies., Disp: 5 mL, Rfl: 3   ondansetron (ZOFRAN-ODT) 4 MG disintegrating tablet, Take 1 tablet (4 mg total) by mouth every 8 (eight) hours as needed for nausea or vomiting., Disp: 8 tablet, Rfl: 0   valACYclovir (VALTREX) 500 MG tablet, TAKE 1 TABLET(500 MG) BY MOUTH TWICE DAILY, Disp: 180 tablet, Rfl: 3   Vitamin D, Ergocalciferol, (DRISDOL) 1.25 MG (50000 UNIT) CAPS capsule, Take 1 capsule (50,000 Units total) by mouth 2 (two) times a week., Disp: 24 capsule, Rfl: 1  Observations/Objective: Today's Vitals   12/31/23 1440  Temp: 100 F (37.8 C)  TempSrc: Temporal   Physical Exam Vitals and nursing note reviewed.  Constitutional:      General: She is not in acute distress.    Appearance: Normal appearance. She is obese.  Pulmonary:     Effort: Pulmonary effort is normal. No respiratory distress.  Neurological:     General: No focal deficit present.     Mental Status: She is alert and oriented to person, place, and time.     Cranial Nerves: No cranial nerve deficit.     Motor: No weakness.     Assessment  and Plan: Exposure to the flu Assessment & Plan: Will treat with tamiflu for prevention  Orders: -     Oseltamivir Phosphate; Take 1 capsule (75 mg total) by mouth 2 (two) times daily for 5 days.  Dispense: 10 capsule; Refill: 0  Viral upper respiratory tract infection Assessment & Plan: May be flu however has not tested, will treat preventive due to exposure and send rx for cough capsules.  Orders: -     Oseltamivir Phosphate; Take 1 capsule (75 mg total) by mouth 2 (two) times daily for 5 days.  Dispense: 10 capsule; Refill: 0  Other orders -     Benzonatate; Take 1 capsule (100 mg total) by mouth every 6 (six) hours as needed.  Dispense: 30 capsule; Refill: 1    Follow Up Instructions: Return for keep same next.   I discussed the assessment and treatment plan with the patient. The patient was provided an opportunity to ask questions, and all were answered. The patient agreed with the plan and demonstrated an understanding of the instructions.   The patient was advised to call back or seek an in-person evaluation if the symptoms worsen or if the condition fails to improve as anticipated.  The above assessment and management plan was discussed with the patient. The patient verbalized understanding of and has agreed to the management plan.   Tricia Sparrow, FNP, have reviewed all documentation for this visit. The documentation on 12/31/23 for the exam, diagnosis, procedures, and orders are all accurate and complete.

## 2024-01-01 ENCOUNTER — Ambulatory Visit: Payer: Self-pay | Admitting: Nurse Practitioner

## 2024-01-09 DIAGNOSIS — F4323 Adjustment disorder with mixed anxiety and depressed mood: Secondary | ICD-10-CM | POA: Diagnosis not present

## 2024-01-12 ENCOUNTER — Encounter: Payer: Self-pay | Admitting: Nurse Practitioner

## 2024-01-12 DIAGNOSIS — Z20828 Contact with and (suspected) exposure to other viral communicable diseases: Secondary | ICD-10-CM | POA: Insufficient documentation

## 2024-01-12 DIAGNOSIS — J069 Acute upper respiratory infection, unspecified: Secondary | ICD-10-CM | POA: Insufficient documentation

## 2024-01-12 NOTE — Assessment & Plan Note (Signed)
 Will treat with tamiflu for prevention

## 2024-01-12 NOTE — Assessment & Plan Note (Signed)
 May be flu however has not tested, will treat preventive due to exposure and send rx for cough capsules.

## 2024-01-23 DIAGNOSIS — F4323 Adjustment disorder with mixed anxiety and depressed mood: Secondary | ICD-10-CM | POA: Diagnosis not present

## 2024-02-06 DIAGNOSIS — F4323 Adjustment disorder with mixed anxiety and depressed mood: Secondary | ICD-10-CM | POA: Diagnosis not present

## 2024-02-24 DIAGNOSIS — F4323 Adjustment disorder with mixed anxiety and depressed mood: Secondary | ICD-10-CM | POA: Diagnosis not present

## 2024-03-05 DIAGNOSIS — F4323 Adjustment disorder with mixed anxiety and depressed mood: Secondary | ICD-10-CM | POA: Diagnosis not present

## 2024-03-19 DIAGNOSIS — M5459 Other low back pain: Secondary | ICD-10-CM | POA: Diagnosis not present

## 2024-04-02 ENCOUNTER — Ambulatory Visit (INDEPENDENT_AMBULATORY_CARE_PROVIDER_SITE_OTHER): Payer: Self-pay | Admitting: Nurse Practitioner

## 2024-04-02 ENCOUNTER — Encounter: Payer: Self-pay | Admitting: Nurse Practitioner

## 2024-04-02 ENCOUNTER — Other Ambulatory Visit (HOSPITAL_COMMUNITY)
Admission: RE | Admit: 2024-04-02 | Discharge: 2024-04-02 | Disposition: A | Discharge status: Home / Self Care | Source: Ambulatory Visit | Attending: Nurse Practitioner | Admitting: Nurse Practitioner

## 2024-04-02 VITALS — BP 100/70 | HR 73 | Temp 98.5°F | Ht 66.0 in | Wt 269.4 lb

## 2024-04-02 DIAGNOSIS — E559 Vitamin D deficiency, unspecified: Secondary | ICD-10-CM

## 2024-04-02 DIAGNOSIS — Z113 Encounter for screening for infections with a predominantly sexual mode of transmission: Secondary | ICD-10-CM

## 2024-04-02 DIAGNOSIS — Z0000 Encounter for general adult medical examination without abnormal findings: Secondary | ICD-10-CM | POA: Diagnosis not present

## 2024-04-02 DIAGNOSIS — Z2821 Immunization not carried out because of patient refusal: Secondary | ICD-10-CM

## 2024-04-02 DIAGNOSIS — Z124 Encounter for screening for malignant neoplasm of cervix: Secondary | ICD-10-CM | POA: Diagnosis not present

## 2024-04-02 DIAGNOSIS — Z1231 Encounter for screening mammogram for malignant neoplasm of breast: Secondary | ICD-10-CM

## 2024-04-02 DIAGNOSIS — E7800 Pure hypercholesterolemia, unspecified: Secondary | ICD-10-CM

## 2024-04-02 DIAGNOSIS — E66813 Obesity, class 3: Secondary | ICD-10-CM | POA: Diagnosis not present

## 2024-04-02 DIAGNOSIS — D508 Other iron deficiency anemias: Secondary | ICD-10-CM | POA: Diagnosis not present

## 2024-04-02 DIAGNOSIS — Z6841 Body Mass Index (BMI) 40.0 and over, adult: Secondary | ICD-10-CM | POA: Diagnosis not present

## 2024-04-02 DIAGNOSIS — F4323 Adjustment disorder with mixed anxiety and depressed mood: Secondary | ICD-10-CM | POA: Diagnosis not present

## 2024-04-02 NOTE — Progress Notes (Signed)
 [name], CMA,acting as a Neurosurgeon for Gaines Ada, FNP.,have documented all relevant documentation on the behalf of Gaines Ada, FNP,as directed by  Gaines Ada, FNP while in the presence of Gaines Ada, FNP.  Subjective:  Patient ID: Tricia Curry , female    DOB: [birth year] , 48 y.o.   MRN: [id number]  Chief Complaint  Patient presents with   Annual Exam    Patient presents today for HM, Patient reports compliance with medication. Patient denies any chest pain, SOB, or headaches.    Leg Pain    Patient reports she has right leg pain, she reports sometimes she has a sharp pain, muscle spasms, and sometimes sore to the touch. She reports she was told it was her sciatica nerve in her leg that got hurt in her fall.     HPI  Patient presents for physical.  Complains of left hip pain and pain, bilateral plantar fasciitis , [facility] following s/p fall in August 2024.  No numbness, tingling.  Taking tylenol  arthritis, muscle relaxant.   No frequent headaches, has dizziness with increased salt intake, monitoring intake of salt and pork.  Palpitations occur with anxiety, she does therapy biweekly and this is greatly improved. No shortness of breath, cough, congestion.  No nausea, vomiting, diarrhea or constipation, history of diverticulitis, modifies diet, avoids causative foods.  Edema in feet and legs at end of day, relieved with elevation and rest, wears compression socks at work.   Sleep is improved with therapy and anxiety control.    Has concerns about increased weight gain.  She has been to weight management clinic. She has issues with her depression eating. She eats to calm her anxiety down. She tries to keep alcohol and junk food out of the house. No fried foods. Difficulty with keeping her cravings under controlled. She was given metformin  at [facility]. Limiting her eating out fast food and meal prepping. She is keeping more lettuce and spinach in the house for  her sandwiches. She is willing to go back to [facility]. She is unable to walk for exercise since having the fall and flaring her sciatica.     Gynecologic Exam The patient's pertinent negatives include no genital itching or vaginal discharge. The patient is experiencing no pain. She is not pregnant. Pertinent negatives include no chills, dysuria, fever, headaches, rash or urgency. She is sexually active. No, her partner does not have an STD. She uses tubal ligation for contraception. Her menstrual history has been irregular (she has not had any vaginal bleeding or spotting in more than a year). Her past medical history is significant for endometriosis.     Past Medical History:  Diagnosis Date   Allergy 05/2004   Peanuts and [place] pigs   Anemia    Anxiety    Asthma    Back pain    Closed fracture of right femur with routine healing 09/18/2023   Constipation    Depression    Diverticulitis 04/12/2023   Fall 09/18/2023   Fatigue    Food allergy, peanut    GERD (gastroesophageal reflux disease)    Insulin  resistance 08/29/2020   Joint pain    Shortness of breath    Shortness of breath on exertion    Swelling of both lower extremities    Vitamin D  deficiency      Family History  Problem Relation Age of Onset   Stroke Maternal Aunt    Asthma Maternal Aunt  Breast cancer Sister    Birth defects Sister    Hypertension Mother    Depression Mother    Anxiety disorder Mother    Sleep apnea Mother    Hypertension Maternal Grandfather    Arthritis Maternal Grandmother    Stroke Maternal Grandmother    Cancer Sister    ADD / ADHD Son      Current Outpatient Medications:    ACCU-CHEK GUIDE test strip, USE UP TO FOUR TIMES DAILY AS NEEDED, Disp: 100 strip, Rfl: 2   Accu-Chek Softclix Lancets lancets, USE UP TO FOUR TIMES DAILY AS DIRECTED, Disp: 100 each, Rfl: 2   albuterol  (VENTOLIN  HFA) 108 (90 Base) MCG/ACT inhaler, INHALE 1 TO 2 PUFFS INTO THE LUNGS EVERY  6 HOURS AS NEEDED FOR WHEEZING OR SHORTNESS OF BREATH, Disp: 18 g, Rfl: 2   benzonatate  (TESSALON  PERLES) 100 MG capsule, Take 1 capsule (100 mg total) by mouth every 6 (six) hours as needed., Disp: 30 capsule, Rfl: 1   blood glucose meter kit and supplies KIT, Dispense based on patient and insurance preference. Use up to four times daily as directed. (FOR ICD-9 250.00, 250.01)., Disp: 1 each, Rfl: 0   Chlorphen-PE-Acetaminophen  ([name] AD) 4-10-325 MG TABS, Take 1 tablet by mouth daily as needed., Disp: 30 tablet, Rfl: 1   diclofenac  Sodium (VOLTAREN ) 1 % GEL, Apply 2 g topically 4 (four) times daily., Disp: 50 g, Rfl: 1   diphenhydrAMINE  (BENADRYL ) 25 mg capsule, Take 25 mg by mouth daily as needed for allergies., Disp: , Rfl:    glucose blood (ACCU-CHEK GUIDE) test strip, USE UP TO FOUR TIMES DAILY AS NEEDED, Disp: 100 strip, Rfl: 2   methocarbamol  (ROBAXIN ) 500 MG tablet, Take 1 tablet (500 mg total) by mouth 2 (two) times daily., Disp: 20 tablet, Rfl: 0   naproxen  (NAPROSYN ) 500 MG tablet, Take 1 tablet (500 mg total) by mouth 2 (two) times daily., Disp: 30 tablet, Rfl: 0   olopatadine  (PATANOL) 0.1 % ophthalmic solution, Place 1 drop into both eyes daily as needed for allergies., Disp: 5 mL, Rfl: 3   ondansetron  (ZOFRAN -ODT) 4 MG disintegrating tablet, Take 1 tablet (4 mg total) by mouth every 8 (eight) hours as needed for nausea or vomiting., Disp: 8 tablet, Rfl: 0   Vitamin D , Ergocalciferol , (DRISDOL ) 1.25 MG (50000 UNIT) CAPS capsule, Take 1 capsule (50,000 Units total) by mouth 2 (two) times a week., Disp: 24 capsule, Rfl: 1   metroNIDAZOLE  (FLAGYL ) 500 MG tablet, Take 1 tablet (500 mg total) by mouth 3 (three) times daily for 10 days., Disp: 30 tablet, Rfl: 0   valACYclovir  (VALTREX ) 500 MG tablet, TAKE 1 TABLET(500 MG) BY MOUTH TWICE DAILY, Disp: 180 tablet, Rfl: 3   Allergies  Allergen Reactions   Peanut-Containing Drug Products Anaphylaxis     Review of Systems  Constitutional:   Negative for chills, fatigue and fever.  HENT:  Negative for congestion and dental problem.   Respiratory:  Negative for cough, chest tightness and shortness of breath.   Cardiovascular:  Positive for palpitations. Negative for chest pain.  Endocrine: Negative for polydipsia, polyphagia and polyuria.  Genitourinary:  Negative for dysuria, genital sores, menstrual problem, urgency, vaginal bleeding, vaginal discharge and vaginal pain.  Musculoskeletal:  Positive for arthralgias (left hip pain, sciatica). Negative for gait problem and joint swelling.  Skin:  Negative for rash and wound.  Neurological:  Negative for dizziness, light-headedness and headaches.  Psychiatric/Behavioral:  Negative for sleep disturbance.  Today's Vitals   04/02/24 0910  BP: 100/70  Pulse: 73  Temp: 98.5 F (36.9 C)  TempSrc: Oral  Weight: 269 lb 6.4 oz (122.2 kg)  Height: 5' 6 (1.676 m)  PainSc: 0-No pain   Body mass index is 43.48 kg/m.  Wt Readings from Last 3 Encounters:  04/02/24 269 lb 6.4 oz (122.2 kg)  09/18/23 274 lb 6.4 oz (124.5 kg)  07/03/23 282 lb (127.9 kg)     Objective:  Physical Exam Vitals and nursing note reviewed. Exam conducted with a chaperone present.  Constitutional:      General: She is not in acute distress.    Appearance: Normal appearance. She is obese.  HENT:     Head: Normocephalic and atraumatic.     Right Ear: Tympanic membrane, ear canal and external ear normal. There is no impacted cerumen.     Left Ear: Tympanic membrane, ear canal and external ear normal. There is no impacted cerumen.     Nose: Nose normal.     Mouth/Throat:     Mouth: Mucous membranes are moist.   Eyes:     Conjunctiva/sclera: Conjunctivae normal.     Pupils: Pupils are equal, round, and reactive to light.   Neck:     Vascular: No carotid bruit.   Cardiovascular:     Rate and Rhythm: Normal rate and regular rhythm.     Pulses: Normal pulses.     Heart sounds: Normal heart sounds.  No murmur heard. Pulmonary:     Effort: Pulmonary effort is normal. No respiratory distress.     Breath sounds: Normal breath sounds. No wheezing.  Abdominal:     Palpations: Abdomen is soft. There is no mass.     Tenderness: There is no abdominal tenderness.  Genitourinary:    General: Normal vulva.     Exam position: Lithotomy position.     Pubic Area: No rash.      Tanner stage (genital): 5.     Labia:        Right: No rash, tenderness or lesion.        Left: No rash, tenderness or lesion.      Urethra: No urethral swelling.     Vagina: Vaginal discharge (thin, white) present. No erythema or bleeding.     Cervix: Normal.     Uterus: Normal.      Adnexa: Right adnexa normal and left adnexa normal.       Right: No mass or tenderness.         Left: No mass or tenderness.       Rectum: Normal. Guaiac result negative. No tenderness, anal fissure, external hemorrhoid or internal hemorrhoid.   Musculoskeletal:        General: Tenderness (left hip) present. No deformity.     Cervical back: Normal range of motion.  Lymphadenopathy:     Cervical: No cervical adenopathy.   Skin:    General: Skin is warm and dry.     Capillary Refill: Capillary refill takes less than 2 seconds.   Neurological:     General: No focal deficit present.     Mental Status: She is alert and oriented to person, place, and time.     Cranial Nerves: No cranial nerve deficit.     Gait: Gait normal.   Psychiatric:        Mood and Affect: Mood normal.        Behavior: Behavior normal.        Thought  Content: Thought content normal.        Judgment: Judgment normal.      Assessment And Plan:  Encounter for annual health examination Assessment & Plan: Behavior modifications discussed and diet history reviewed.   Pt will continue to exercise regularly and modify diet with low GI, plant based foods and decrease intake of processed foods.  Recommend intake of daily multivitamin, Vitamin D , and calcium.   Recommend mammogram and colonoscopy for preventive screenings, as well as recommend immunizations that include influenza, TDAP, and Shingles   Orders: -     CBC with Differential/Platelet -     CMP14+EGFR  Elevated cholesterol Assessment & Plan: Stable, diet controlled and continue low fat diet  Orders: -     Lipid panel  Iron deficiency anemia secondary to inadequate dietary iron intake Assessment & Plan: Will check iron levels, had been taking iron supplement  Orders: -     Iron, TIBC and Ferritin Panel  COVID-19 vaccination declined Assessment & Plan: Declines covid 19 vaccine. Discussed risk of covid 39 and if she changes her mind about the vaccine to call the office. Education has been provided regarding the importance of this vaccine but patient still declined. Advised may receive this vaccine at local pharmacy or Health Dept.or vaccine clinic. Aware to provide a copy of the vaccination record if obtained from local pharmacy or Health Dept.  Encouraged to take multivitamin, vitamin d , vitamin c and zinc to increase immune system. Aware can call office if would like to have vaccine here at office. Verbalized acceptance and understanding.    Encounter for Papanicolaou smear of cervix Assessment & Plan: She has thin white discharge present in vaginal wall and cervix.   Orders: -     Ambulatory referral to Obstetrics / Gynecology -     Cytology - PAP  Screening for STDs (sexually transmitted diseases) -     NuSwab Vaginitis Plus (VG+) -     HIV Antibody (routine testing w rflx) -     HSV 1 and 2 Ab, IgG -     RPR  Vitamin D  deficiency Assessment & Plan: Will check vitamin D  level and supplement as needed.    Also encouraged to spend 15 minutes in the sun daily.     Class 3 severe obesity due to excess calories without serious comorbidity with body mass index (BMI) of 40.0 to 44.9 in adult Assessment & Plan: She is encouraged to strive for BMI less than 30 to  decrease cardiac risk. Advised to aim for at least 150 minutes of exercise per week.  She is doing better with her weight loss, has lost 5 lbs since her last visit.  [facility], referral placed.   Orders: -     Hemoglobin A1c -     Amb Ref to [facility]  Encounter for screening mammogram for breast cancer Assessment & Plan: Pt instructed on Self Breast Exam.According to ACOG guidelines Women aged 39 and older are recommended to get an annual mammogram. Order placed for mammogram screening   Orders: -     Digital Screening Mammogram, Left and Right; Future    Return for 1 year physical, 6 month chol check.  Patient was given opportunity to ask questions. Patient verbalized understanding of the plan and was able to repeat key elements of the plan. All questions were answered to their satisfaction.   I have reviewed this encounter including the  documentation in this note and/or discussed this patient with Delon Louder, FNP Student. I am certifying that I agree with the content of this note as the primary care nurse practitioner.  Gaines Ada, DNP, FNP-BC  I, Gaines Ada, FNP, have reviewed all documentation for this visit. The documentation on 04/02/24 for the exam, diagnosis, procedures, and orders are all accurate and complete.   IF YOU HAVE BEEN REFERRED TO A SPECIALIST, IT MAY TAKE 1-2 WEEKS TO SCHEDULE/PROCESS THE REFERRAL. IF YOU HAVE NOT HEARD FROM US /SPECIALIST IN TWO WEEKS, PLEASE GIVE US  A CALL AT [phone].

## 2024-04-03 LAB — CMP14+EGFR
ALT: 13 IU/L (ref 0–32)
AST: 14 IU/L (ref 0–40)
Albumin: 3.6 g/dL — ABNORMAL LOW (ref 3.9–4.9)
Alkaline Phosphatase: 72 IU/L (ref 44–121)
BUN/Creatinine Ratio: 10 (ref 9–23)
BUN: 9 mg/dL (ref 6–24)
Bilirubin Total: 0.2 mg/dL (ref 0.0–1.2)
CO2: 24 mmol/L (ref 20–29)
Calcium: 8.8 mg/dL (ref 8.7–10.2)
Chloride: 103 mmol/L (ref 96–106)
Creatinine, Ser: 0.9 mg/dL (ref 0.57–1.00)
Globulin, Total: 2.7 g/dL (ref 1.5–4.5)
Glucose: 74 mg/dL (ref 70–99)
Potassium: 4.5 mmol/L (ref 3.5–5.2)
Sodium: 142 mmol/L (ref 134–144)
Total Protein: 6.3 g/dL (ref 6.0–8.5)
eGFR: 79 mL/min/{1.73_m2} (ref >59)

## 2024-04-03 LAB — CBC WITH DIFFERENTIAL/PLATELET
Basophils Absolute: 0 10*3/uL (ref 0.0–0.2)
Basos: 1 %
EOS (ABSOLUTE): 0.1 10*3/uL (ref 0.0–0.4)
Eos: 2 %
Hematocrit: 40.7 % (ref 34.0–46.6)
Hemoglobin: 12.3 g/dL (ref 11.1–15.9)
Immature Grans (Abs): 0 10*3/uL (ref 0.0–0.1)
Immature Granulocytes: 0 %
Lymphocytes Absolute: 1.8 10*3/uL (ref 0.7–3.1)
Lymphs: 41 %
MCH: 26.8 pg (ref 26.6–33.0)
MCHC: 30.2 g/dL — ABNORMAL LOW (ref 31.5–35.7)
MCV: 89 fL (ref 79–97)
Monocytes Absolute: 0.3 10*3/uL (ref 0.1–0.9)
Monocytes: 7 %
Neutrophils Absolute: 2.1 10*3/uL (ref 1.4–7.0)
Neutrophils: 48 %
Platelets: 282 10*3/uL (ref 150–450)
RBC: 4.59 x10E6/uL (ref 3.77–5.28)
RDW: 14.2 % (ref 11.7–15.4)
WBC: 4.4 10*3/uL (ref 3.4–10.8)

## 2024-04-03 LAB — HSV 1 AND 2 AB, IGG
HSV 1 Glycoprotein G Ab, IgG: REACTIVE — AB
HSV 2 IgG, Type Spec: NONREACTIVE

## 2024-04-03 LAB — LIPID PANEL
Chol/HDL Ratio: 3.5 ratio (ref 0.0–4.4)
Cholesterol, Total: 174 mg/dL (ref 100–199)
HDL: 50 mg/dL (ref >39)
LDL Chol Calc (NIH): 103 mg/dL — ABNORMAL HIGH (ref 0–99)
Triglycerides: 117 mg/dL (ref 0–149)
VLDL Cholesterol Cal: 21 mg/dL (ref 5–40)

## 2024-04-03 LAB — HIV ANTIBODY (ROUTINE TESTING W REFLEX): HIV Screen 4th Generation wRfx: NONREACTIVE

## 2024-04-03 LAB — IRON,TIBC AND FERRITIN PANEL
Ferritin: 21 ng/mL (ref 15–150)
Iron Saturation: 36 % (ref 15–55)
Iron: 110 ug/dL (ref 27–159)
Total Iron Binding Capacity: 303 ug/dL (ref 250–450)
UIBC: 193 ug/dL (ref 131–425)

## 2024-04-03 LAB — RPR: RPR Ser Ql: NONREACTIVE

## 2024-04-03 LAB — HEMOGLOBIN A1C
Est. average glucose Bld gHb Est-mCnc: 91 mg/dL
Hgb A1c MFr Bld: 4.8 % (ref 4.8–5.6)

## 2024-04-05 ENCOUNTER — Ambulatory Visit: Payer: Self-pay | Admitting: Nurse Practitioner

## 2024-04-05 LAB — NUSWAB VAGINITIS PLUS (VG+)
Candida albicans, NAA: NEGATIVE
Candida glabrata, NAA: NEGATIVE
Chlamydia trachomatis, NAA: NEGATIVE
Neisseria gonorrhoeae, NAA: NEGATIVE
Trich vag by NAA: POSITIVE — AB

## 2024-04-06 ENCOUNTER — Other Ambulatory Visit: Payer: Self-pay | Admitting: Nurse Practitioner

## 2024-04-06 ENCOUNTER — Telehealth: Payer: Self-pay | Admitting: Nurse Practitioner

## 2024-04-06 DIAGNOSIS — A599 Trichomoniasis, unspecified: Secondary | ICD-10-CM

## 2024-04-06 MED ORDER — METRONIDAZOLE 500 MG PO TABS
500.0000 mg | ORAL_TABLET | Freq: Three times a day (TID) | ORAL | 0 refills | Status: AC
Start: 2024-04-06 — End: 2024-04-16

## 2024-04-06 MED ORDER — VALACYCLOVIR HCL 500 MG PO TABS: ORAL_TABLET | ORAL | 3 refills | Status: AC

## 2024-04-06 NOTE — Telephone Encounter (Unsigned)
 Copied from CRM [id number]. Topic: Clinical - Medication Refill >> Apr 06, 2024 12:20 PM Silvana PARAS wrote: Medication: Metronidacole (not on list)   Has the patient contacted their pharmacy? Yes (Agent: If no, request that the patient contact the pharmacy for the refill. If patient does not wish to contact the pharmacy document the reason why and proceed with request.) (Agent: If yes, when and what did the pharmacy advise?)  This is the patient's preferred pharmacy:  [id number] [address], [place] - [address] AT [facility] [address] [facility] [address] [facility] [address] [place] [address] Phone: [phone] Fax: [phone]    Is this the correct pharmacy for this prescription? Yes If no, delete pharmacy and type the correct one.   Has the prescription been filled recently? No  Is the patient out of the medication? Yes  Has the patient been seen for an appointment in the last year OR does the patient have an upcoming appointment? Yes  Can we respond through MyChart? Yes  Agent: Please be advised that Rx refills may take up to 3 business days. We ask that you follow-up with your pharmacy.

## 2024-04-07 LAB — CYTOLOGY - PAP
Adequacy: ABSENT
Comment: NEGATIVE
Diagnosis: UNDETERMINED — AB
High risk HPV: NEGATIVE

## 2024-04-09 ENCOUNTER — Other Ambulatory Visit: Payer: Self-pay | Admitting: Nurse Practitioner

## 2024-04-09 ENCOUNTER — Encounter (INDEPENDENT_AMBULATORY_CARE_PROVIDER_SITE_OTHER): Payer: Self-pay

## 2024-04-09 DIAGNOSIS — R87610 Atypical squamous cells of undetermined significance on cytologic smear of cervix (ASC-US): Secondary | ICD-10-CM

## 2024-04-12 ENCOUNTER — Encounter: Payer: Self-pay | Admitting: Nurse Practitioner

## 2024-04-12 NOTE — Assessment & Plan Note (Signed)

## 2024-04-12 NOTE — Patient Instructions (Signed)
 Health Maintenance  Topic Date Due   Hepatitis B Vaccine (1 of 3 - 19+ 3-dose series) Never done   Colon Cancer Screening  Never done   Mammogram  09/04/2022   COVID-19 Vaccine (4 - 2024-25 season) 04/18/2024*   Flu Shot  05/15/2024   DTaP/Tdap/Td vaccine (2 - Td or Tdap) 10/15/2024   Pap with HPV screening  04/02/2029   Hepatitis C Screening  Completed   HIV Screening  Completed   HPV Vaccine  Aged Out   Meningitis B Vaccine  Aged Out  *Topic was postponed. The date shown is not the original due date.

## 2024-04-12 NOTE — Assessment & Plan Note (Signed)
 Stable, diet controlled and continue low fat diet

## 2024-04-12 NOTE — Assessment & Plan Note (Signed)
 She is encouraged to strive for BMI less than 30 to decrease cardiac risk. Advised to aim for at least 150 minutes of exercise per week.  She is doing better with her weight loss, has lost 5 lbs since her last visit.  [facility], referral placed.

## 2024-04-12 NOTE — Assessment & Plan Note (Signed)
 Will check iron levels, had been taking iron supplement

## 2024-04-12 NOTE — Assessment & Plan Note (Signed)
 Will check vitamin D level and supplement as needed.    Also encouraged to spend 15 minutes in the sun daily.

## 2024-04-12 NOTE — Assessment & Plan Note (Signed)
 Pt instructed on Self Breast Exam.According to ACOG guidelines Women aged 47 and older are recommended to get an annual mammogram. Order placed for mammogram screening

## 2024-04-12 NOTE — Assessment & Plan Note (Signed)

## 2024-04-12 NOTE — Assessment & Plan Note (Signed)
 She has thin white discharge present in vaginal wall and cervix.

## 2024-04-14 ENCOUNTER — Institutional Professional Consult (permissible substitution) (INDEPENDENT_AMBULATORY_CARE_PROVIDER_SITE_OTHER): Admitting: Adult Health

## 2024-04-16 ENCOUNTER — Encounter: Payer: Self-pay | Admitting: Nurse Practitioner

## 2024-04-16 DIAGNOSIS — F4323 Adjustment disorder with mixed anxiety and depressed mood: Secondary | ICD-10-CM | POA: Diagnosis not present

## 2024-04-18 ENCOUNTER — Other Ambulatory Visit: Payer: Self-pay | Admitting: Nurse Practitioner

## 2024-04-18 MED ORDER — NYSTATIN 100000 UNIT/ML MT SUSP: 5.0000 mL | Freq: Four times a day (QID) | OROMUCOSAL | 0 refills | Status: AC

## 2024-05-14 DIAGNOSIS — F4323 Adjustment disorder with mixed anxiety and depressed mood: Secondary | ICD-10-CM | POA: Diagnosis not present

## 2024-05-25 ENCOUNTER — Other Ambulatory Visit: Payer: Self-pay | Admitting: Medical Genetics

## 2024-05-28 ENCOUNTER — Telehealth: Admitting: Physician Assistant

## 2024-05-28 ENCOUNTER — Encounter: Payer: Self-pay | Admitting: Nurse Practitioner

## 2024-05-28 DIAGNOSIS — R109 Unspecified abdominal pain: Secondary | ICD-10-CM

## 2024-05-28 DIAGNOSIS — R112 Nausea with vomiting, unspecified: Secondary | ICD-10-CM

## 2024-05-28 DIAGNOSIS — F4323 Adjustment disorder with mixed anxiety and depressed mood: Secondary | ICD-10-CM | POA: Diagnosis not present

## 2024-05-29 ENCOUNTER — Telehealth: Admitting: Physician Assistant

## 2024-05-29 DIAGNOSIS — K5792 Diverticulitis of intestine, part unspecified, without perforation or abscess without bleeding: Secondary | ICD-10-CM

## 2024-05-29 MED ORDER — AMOXICILLIN-POT CLAVULANATE 875-125 MG PO TABS
1.0000 | ORAL_TABLET | Freq: Two times a day (BID) | ORAL | 0 refills | Status: DC
Start: 2024-05-29 — End: 2024-07-24

## 2024-05-29 MED ORDER — ONDANSETRON 4 MG PO TBDP
4.0000 mg | ORAL_TABLET | Freq: Three times a day (TID) | ORAL | 0 refills | Status: AC | PRN
Start: 2024-05-29 — End: ?

## 2024-05-29 NOTE — Progress Notes (Signed)
 Virtual Visit Consent   Tricia Curry, you are scheduled for a virtual visit with a [facility] provider today. Just as with appointments in the office, your consent must be obtained to participate. Your consent will be active for this visit and any virtual visit you may have with one of our providers in the next 365 days. If you have a MyChart account, a copy of this consent can be sent to you electronically.  As this is a virtual visit, video technology does not allow for your provider to perform a traditional examination. This may limit your provider's ability to fully assess your condition. If your provider identifies any concerns that need to be evaluated in person or the need to arrange testing (such as labs, EKG, etc.), we will make arrangements to do so. Although advances in technology are sophisticated, we cannot ensure that it will always work on either your end or our end. If the connection with a video visit is poor, the visit may have to be switched to a telephone visit. With either a video or telephone visit, we are not always able to ensure that we have a secure connection.  By engaging in this virtual visit, you consent to the provision of healthcare and authorize for your insurance to be billed (if applicable) for the services provided during this visit. Depending on your insurance coverage, you may receive a charge related to this service.  I need to obtain your verbal consent now. Are you willing to proceed with your visit today? Tricia Curry has provided verbal consent on 05/29/2024 for a virtual visit (video or telephone). Tricia CHRISTELLA Dickinson, PA-C  Date: 05/29/2024 12:14 PM   Virtual Visit via Video Note   IDelon CHRISTELLA Curry, connected with  Tricia Curry  ([id number], 1975-12-02) on 05/29/24 at 11:45 AM EDT by a video-enabled telemedicine application and verified that I am speaking with the correct person using two identifiers.  Location: Patient: Virtual Visit  Location Patient: Home Provider: Virtual Visit Location Provider: Home Office   I discussed the limitations of evaluation and management by telemedicine and the availability of in person appointments. The patient expressed understanding and agreed to proceed.    History of Present Illness: Tricia Curry is a 48 y.o. who identifies as a female who was assigned female at birth, and is being seen today for abdominal pain.  HPI: Abdominal Pain This is a recurrent problem. Episode onset: Started last week and worsened this week; last flare was 02/2023. The onset quality is sudden. The problem occurs constantly. The problem has been unchanged. The pain is located in the suprapubic region (lower abdomen). The pain is moderate. The quality of the pain is cramping and a sensation of fullness. The abdominal pain does not radiate. Associated symptoms include anorexia, a fever (subjective fevers with chills and hot flashes) and nausea. Pertinent negatives include no belching or vomiting. The pain is aggravated by bowel movement.     Problems:  Patient Active Problem List   Diagnosis Date Noted   COVID-19 vaccination declined 09/18/2023   Influenza vaccination declined 09/18/2023   Class 3 severe obesity due to excess calories without serious comorbidity with body mass index (BMI) of 40.0 to 44.9 in adult 09/18/2023   Encounter for annual health examination 04/12/2023   Elevated cholesterol 04/12/2023   Vitamin D  deficiency 04/12/2023   Class 3 severe obesity due to excess calories without serious comorbidity with body mass index (BMI) of 45.0 to 49.9 in adult 04/12/2023  Encounter for Papanicolaou smear of cervix 04/12/2023   Encounter for screening colonoscopy 04/12/2023   Encounter for screening mammogram for breast cancer 04/12/2023   Anxiety and depression 04/14/2019   Iron deficiency anemia secondary to inadequate dietary iron intake 01/27/2019    Allergies:  Allergies  Allergen Reactions    Peanut-Containing Drug Products Anaphylaxis   Medications:  Current Outpatient Medications:    amoxicillin -clavulanate (AUGMENTIN ) 875-125 MG tablet, Take 1 tablet by mouth 2 (two) times daily., Disp: 20 tablet, Rfl: 0   ondansetron  (ZOFRAN -ODT) 4 MG disintegrating tablet, Take 1 tablet (4 mg total) by mouth every 8 (eight) hours as needed., Disp: 20 tablet, Rfl: 0   ACCU-CHEK GUIDE test strip, USE UP TO FOUR TIMES DAILY AS NEEDED, Disp: 100 strip, Rfl: 2   Accu-Chek Softclix Lancets lancets, USE UP TO FOUR TIMES DAILY AS DIRECTED, Disp: 100 each, Rfl: 2   albuterol  (VENTOLIN  HFA) 108 (90 Base) MCG/ACT inhaler, INHALE 1 TO 2 PUFFS INTO THE LUNGS EVERY 6 HOURS AS NEEDED FOR WHEEZING OR SHORTNESS OF BREATH, Disp: 18 g, Rfl: 2   benzonatate  (TESSALON  PERLES) 100 MG capsule, Take 1 capsule (100 mg total) by mouth every 6 (six) hours as needed., Disp: 30 capsule, Rfl: 1   blood glucose meter kit and supplies KIT, Dispense based on patient and insurance preference. Use up to four times daily as directed. (FOR ICD-9 250.00, 250.01)., Disp: 1 each, Rfl: 0   Chlorphen-PE-Acetaminophen  ([name] AD) 4-10-325 MG TABS, Take 1 tablet by mouth daily as needed., Disp: 30 tablet, Rfl: 1   diclofenac  Sodium (VOLTAREN ) 1 % GEL, Apply 2 g topically 4 (four) times daily., Disp: 50 g, Rfl: 1   diphenhydrAMINE  (BENADRYL ) 25 mg capsule, Take 25 mg by mouth daily as needed for allergies., Disp: , Rfl:    glucose blood (ACCU-CHEK GUIDE) test strip, USE UP TO FOUR TIMES DAILY AS NEEDED, Disp: 100 strip, Rfl: 2   methocarbamol  (ROBAXIN ) 500 MG tablet, Take 1 tablet (500 mg total) by mouth 2 (two) times daily., Disp: 20 tablet, Rfl: 0   naproxen  (NAPROSYN ) 500 MG tablet, Take 1 tablet (500 mg total) by mouth 2 (two) times daily., Disp: 30 tablet, Rfl: 0   nystatin  (MYCOSTATIN ) 100000 UNIT/ML suspension, Take 5 mLs (500,000 Units total) by mouth 4 (four) times daily. Swish and spit, Disp: 60 mL, Rfl: 0   olopatadine  (PATANOL)  0.1 % ophthalmic solution, Place 1 drop into both eyes daily as needed for allergies., Disp: 5 mL, Rfl: 3   valACYclovir  (VALTREX ) 500 MG tablet, TAKE 1 TABLET(500 MG) BY MOUTH TWICE DAILY, Disp: 180 tablet, Rfl: 3   Vitamin D , Ergocalciferol , (DRISDOL ) 1.25 MG (50000 UNIT) CAPS capsule, Take 1 capsule (50,000 Units total) by mouth 2 (two) times a week., Disp: 24 capsule, Rfl: 1  Observations/Objective: Patient is well-developed, well-nourished in no acute distress.  Resting comfortably at home.  Head is normocephalic, atraumatic.  No labored breathing.  Speech is clear and coherent with logical content.  Patient is alert and oriented at baseline.    Assessment and Plan: 1. Diverticulitis (Primary) - amoxicillin -clavulanate (AUGMENTIN ) 875-125 MG tablet; Take 1 tablet by mouth 2 (two) times daily.  Dispense: 20 tablet; Refill: 0 - ondansetron  (ZOFRAN -ODT) 4 MG disintegrating tablet; Take 1 tablet (4 mg total) by mouth every 8 (eight) hours as needed.  Dispense: 20 tablet; Refill: 0  - Recurrent diverticulitis symptoms, originally diagnosed in 02/2023, but not responding to clear liquid diet as it normally does - Augmentin   prescribed - Zofran  for nausea - Continue clear liquid diet until pain subsides, then increase diet slowly as tolerated - Strict ER precautions if worsening or not improving in the next 48-72 hours  Follow Up Instructions: I discussed the assessment and treatment plan with the patient. The patient was provided an opportunity to ask questions and all were answered. The patient agreed with the plan and demonstrated an understanding of the instructions.  A copy of instructions were sent to the patient via MyChart unless otherwise noted below.    The patient was advised to call back or seek an in-person evaluation if the symptoms worsen or if the condition fails to improve as anticipated.    Tricia CHRISTELLA Dickinson, PA-C

## 2024-05-29 NOTE — Patient Instructions (Signed)
 Tricia Curry, thank you for joining [name], PA-C for today's virtual visit.  While this provider is not your primary care provider (PCP), if your PCP is located in our provider database this encounter information will be shared with them immediately following your visit.   A [facility] MyChart account gives you access to today's visit and all your visits, tests, and labs performed at [facility]  click here if you don't have a [facility] MyChart account or go to [email or web address]  Consent: (Patient) Tricia Curry provided verbal consent for this virtual visit at the beginning of the encounter.  Current Medications:  Current Outpatient Medications:    amoxicillin -clavulanate (AUGMENTIN ) 875-125 MG tablet, Take 1 tablet by mouth 2 (two) times daily., Disp: 20 tablet, Rfl: 0   ondansetron  (ZOFRAN -ODT) 4 MG disintegrating tablet, Take 1 tablet (4 mg total) by mouth every 8 (eight) hours as needed., Disp: 20 tablet, Rfl: 0   ACCU-CHEK GUIDE test strip, USE UP TO FOUR TIMES DAILY AS NEEDED, Disp: 100 strip, Rfl: 2   Accu-Chek Softclix Lancets lancets, USE UP TO FOUR TIMES DAILY AS DIRECTED, Disp: 100 each, Rfl: 2   albuterol  (VENTOLIN  HFA) 108 (90 Base) MCG/ACT inhaler, INHALE 1 TO 2 PUFFS INTO THE LUNGS EVERY 6 HOURS AS NEEDED FOR WHEEZING OR SHORTNESS OF BREATH, Disp: 18 g, Rfl: 2   benzonatate  (TESSALON  PERLES) 100 MG capsule, Take 1 capsule (100 mg total) by mouth every 6 (six) hours as needed., Disp: 30 capsule, Rfl: 1   blood glucose meter kit and supplies KIT, Dispense based on patient and insurance preference. Use up to four times daily as directed. (FOR ICD-9 250.00, 250.01)., Disp: 1 each, Rfl: 0   Chlorphen-PE-Acetaminophen  ([name] AD) 4-10-325 MG TABS, Take 1 tablet by mouth daily as needed., Disp: 30 tablet, Rfl: 1   diclofenac  Sodium (VOLTAREN ) 1 % GEL, Apply 2 g topically 4 (four) times daily., Disp: 50 g, Rfl: 1   diphenhydrAMINE  (BENADRYL ) 25 mg  capsule, Take 25 mg by mouth daily as needed for allergies., Disp: , Rfl:    glucose blood (ACCU-CHEK GUIDE) test strip, USE UP TO FOUR TIMES DAILY AS NEEDED, Disp: 100 strip, Rfl: 2   methocarbamol  (ROBAXIN ) 500 MG tablet, Take 1 tablet (500 mg total) by mouth 2 (two) times daily., Disp: 20 tablet, Rfl: 0   naproxen  (NAPROSYN ) 500 MG tablet, Take 1 tablet (500 mg total) by mouth 2 (two) times daily., Disp: 30 tablet, Rfl: 0   nystatin  (MYCOSTATIN ) 100000 UNIT/ML suspension, Take 5 mLs (500,000 Units total) by mouth 4 (four) times daily. Swish and spit, Disp: 60 mL, Rfl: 0   olopatadine  (PATANOL) 0.1 % ophthalmic solution, Place 1 drop into both eyes daily as needed for allergies., Disp: 5 mL, Rfl: 3   valACYclovir  (VALTREX ) 500 MG tablet, TAKE 1 TABLET(500 MG) BY MOUTH TWICE DAILY, Disp: 180 tablet, Rfl: 3   Vitamin D , Ergocalciferol , (DRISDOL ) 1.25 MG (50000 UNIT) CAPS capsule, Take 1 capsule (50,000 Units total) by mouth 2 (two) times a week., Disp: 24 capsule, Rfl: 1   Medications ordered in this encounter:  Meds ordered this encounter  Medications   amoxicillin -clavulanate (AUGMENTIN ) 875-125 MG tablet    Sig: Take 1 tablet by mouth 2 (two) times daily.    Dispense:  20 tablet    Refill:  0    Supervising Provider:   LAMPTEY, PHILIP O [[id number]]   ondansetron  (ZOFRAN -ODT) 4 MG disintegrating tablet    Sig: Take 1 tablet (4  mg total) by mouth every 8 (eight) hours as needed.    Dispense:  20 tablet    Refill:  0    Supervising Provider:   LAMPTEY, PHILIP O [[id number]]     *If you need refills on other medications prior to your next appointment, please contact your pharmacy*  Follow-Up: Call back or seek an in-person evaluation if the symptoms worsen or if the condition fails to improve as anticipated.  [facility] [phone]  Other Instructions Diverticulitis  Diverticulitis happens when poop (stool) and bacteria get trapped in small pouches in the colon called  diverticula. These pouches may form if you have a condition called diverticulosis. When the poop and bacteria get trapped, it can cause an infection and inflammation. Diverticulitis may cause severe stomach pain and diarrhea. It can also lead to tissue damage in your colon. This can cause bleeding or blockage. In some cases, the diverticula may burst (rupture). This can cause infected poop to go into other parts of your abdomen. What are the causes? This condition is caused by poop getting trapped in the diverticula. This allows bacteria to grow. It can lead to inflammation and infection. What increases the risk? You are more likely to get this condition if you have diverticulosis. You are also more at risk if: You are overweight or obese. You do not get enough exercise. You drink alcohol. You smoke. You eat a lot of red meat, such as beef, pork, or lamb. You do not get enough fiber. Foods high in fiber include fruits, vegetables, beans, nuts, and whole grains. You are over 26 years of age. What are the signs or symptoms? Symptoms of this condition may include: Pain and tenderness in the abdomen. This pain is often felt on the left side but may occur in other spots. Fever and chills. Nausea and vomiting. Cramping. Bloating. Changes in how often you poop. Blood in your poop. How is this diagnosed? This condition is diagnosed based on your medical history and a physical exam. You may also have tests done to make sure there is nothing else causing your condition. These tests may include: Blood tests. Tests done on your pee (urine). A CT scan of the abdomen. You may need to have a colonoscopy. This is an exam to look at your whole large intestine. During the exam, a tube is put into the opening of your butt (anus) and then moved into your rectum, colon, and other parts of the large intestine. This exam is done to look at the diverticula. It can also see if there is something else that may be  causing your symptoms. How is this treated? Most cases are mild and can be treated at home. You may be told to: Take over-the-counter pain medicine. Only eat and drink clear liquids. Take antibiotics. Rest. More severe cases may need to be treated at a hospital. Treatment may include: Not eating or drinking. Taking pain medicines. Getting antibiotics through an IV. Getting fluids and nutrition through an IV. Surgery. Follow these instructions at home: Medicines Take over-the-counter and prescription medicines only as told by your health care provider. These include fiber supplements, probiotics, and medicines to soften your poop (stool softeners). If you were prescribed antibiotics, take them as told by your provider. Do not stop using the antibiotic even if you start to feel better. Ask your provider if the medicine prescribed to you requires you to avoid driving or using machinery. Eating and drinking  Follow the diet  told by your provider. You may need to only eat and drink liquids. After your symptoms get better, you may be able to return to a more normal diet. You may be told to eat at least 25 grams (25 g) of fiber each day. Fiber makes it easier to poop. Healthy sources of fiber include: Berries. One cup has 4-8 g of fiber. Beans or lentils. One-half cup has 5-8 g of fiber. Green vegetables. One cup has 4 g of fiber. Avoid eating red meat. General instructions Do not use any products that contain nicotine or tobacco. These products include cigarettes, chewing tobacco, and vaping devices, such as e-cigarettes. If you need help quitting, ask your provider. Exercise for at least 30 minutes, 3 times a week. Exercise hard enough to raise your heart rate and break a sweat. Contact a health care provider if: Your pain gets worse. Your pooping does not go back to normal. Your symptoms do not get better with treatment. Your symptoms get worse all of a sudden. You have a fever. You  vomit more than one time. Your poop is bloody, black, or tarry. This information is not intended to replace advice given to you by your health care provider. Make sure you discuss any questions you have with your health care provider. Document Revised: 06/28/2022 Document Reviewed: 06/28/2022 Elsevier Patient Education  2024 Elsevier Inc.   Clear Liquid Diet, Adult A clear liquid diet is a diet that includes only liquids and semi-liquids that you can see through when you hold them up to a light. An example of a semi-liquid is gelatin. No solid food is eaten on this diet. You may need to follow a clear liquid diet if: You have a problem right before or after you have surgery. You did not eat food for a long time. You had: Vomiting, feeling like you may vomit, or nausea. Diarrhea, which is watery poop. You are going to have a test to look at your digestive system. You are going to have bowel surgery. What are the goals of this diet? To help your stomach and digestive system rest. To help clear your digestive system before a test. To make sure that you get enough fluid. To make sure you get some calories for energy. To help you get back to eating like you used to. What are tips for following this plan? This diet does not give you all the nutrients that you need. Choose a variety of the liquids that your health care provider says you can have on this diet. That way, you will get as many nutrients as possible. If you are not sure whether you can have certain things, ask your provider. If you cannot swallow a thin liquid, you will need to thicken it before drinking it. This will stop you from breathing it in. What foods should I eat?  Water and flavored water. Fruit juices that do not have pulp in them. This includes cranberry juice, apple juice, and grape juice. Tea and coffee without milk or cream. Clear broth. Broth-based soups that have been strained to get all the food pieces  out. Flavored gelatin. Honey. Sugar water. Ice or frozen ice pops that do not have any milk, yogurt, fruit pieces, or fruit pulp in them. Clear sodas. Clear sports drinks. The items listed above may not be all the foods and drinks you can have. Talk with an expert in healthy eating called a dietitian to learn more. What food should I avoid? Juices that have  pulp. Milk. Cream or cream-based soups. Yogurt. Solid foods that are not clear liquids or semi-liquids. The items listed above may not be all the foods and drinks you should avoid. Talk with a dietitian to learn more. Questions to ask your health care provider: How long do I need to follow this diet? Are there any medicines that I should change while on this diet? This information is not intended to replace advice given to you by your health care provider. Make sure you discuss any questions you have with your health care provider. Document Revised: 07/04/2023 Document Reviewed: 11/20/2022 Elsevier Patient Education  2024 Elsevier Inc.   If you have been instructed to have an in-person evaluation today at a local Urgent Care facility, please use the link below. It will take you to a list of all of our available [facility], including address, phone number and hours of operation. Please do not delay care.  [facility]  If you or a family member do not have a primary care provider, use the link below to schedule a visit and establish care. When you choose a [facility] primary care physician or advanced practice provider, you gain a long-term partner in health. Find a Primary Care Provider  Learn more about [facility]'s in-office and virtual care options:  - Get Care Now

## 2024-05-29 NOTE — Progress Notes (Signed)
   Thank you for the details you included in the comment boxes. Those details are very helpful in determining the best course of treatment for you and help us  to provide the best care. Because of the symptoms mentioned, we recommend that you schedule a Virtual Urgent Care video visit in order for the provider to better assess what is going on.  The provider will be able to give you a more accurate diagnosis and treatment plan if we can more freely discuss your symptoms and with the addition of a virtual examination.   If you change your visit to a video visit, we will bill your insurance (similar to an office visit) and you will not be charged for this e-Visit. You will be able to stay at home and speak with the first available [facility] advanced practice provider. The link to do a video visit is in the drop down Menu tab of your Welcome screen in MyChart.      I have spent 5 minutes in review of e-visit questionnaire, review and updating patient chart, medical decision making and response to patient.   [name], PA-C

## 2024-06-01 ENCOUNTER — Ambulatory Visit
Admission: RE | Admit: 2024-06-01 | Discharge: 2024-06-01 | Disposition: A | Discharge status: Home / Self Care | Source: Ambulatory Visit | Attending: Nurse Practitioner | Admitting: Nurse Practitioner

## 2024-06-01 DIAGNOSIS — Z1231 Encounter for screening mammogram for malignant neoplasm of breast: Secondary | ICD-10-CM

## 2024-06-01 DIAGNOSIS — F4323 Adjustment disorder with mixed anxiety and depressed mood: Secondary | ICD-10-CM | POA: Diagnosis not present

## 2024-06-06 ENCOUNTER — Other Ambulatory Visit: Payer: Self-pay

## 2024-06-20 ENCOUNTER — Other Ambulatory Visit: Payer: Self-pay | Attending: Medical Genetics

## 2024-06-22 DIAGNOSIS — F4323 Adjustment disorder with mixed anxiety and depressed mood: Secondary | ICD-10-CM | POA: Diagnosis not present

## 2024-07-24 ENCOUNTER — Telehealth: Admitting: Physician Assistant

## 2024-07-24 DIAGNOSIS — K5792 Diverticulitis of intestine, part unspecified, without perforation or abscess without bleeding: Secondary | ICD-10-CM

## 2024-07-24 DIAGNOSIS — K219 Gastro-esophageal reflux disease without esophagitis: Secondary | ICD-10-CM

## 2024-07-24 MED ORDER — OMEPRAZOLE 20 MG PO CPDR
20.0000 mg | DELAYED_RELEASE_CAPSULE | Freq: Every day | ORAL | 0 refills | Status: AC
Start: 2024-07-24 — End: ?

## 2024-07-24 MED ORDER — AMOXICILLIN-POT CLAVULANATE 875-125 MG PO TABS
1.0000 | ORAL_TABLET | Freq: Two times a day (BID) | ORAL | 0 refills | Status: AC
Start: 2024-07-24 — End: 2024-08-03

## 2024-07-24 NOTE — Patient Instructions (Signed)
 Tricia Curry, thank you for joining Tricia CHRISTELLA Dickinson, PA-C for today's virtual visit.  While this provider is not your primary care provider (PCP), if your PCP is located in our provider database this encounter information will be shared with them immediately following your visit.   A [facility] MyChart account gives you access to today's visit and all your visits, tests, and labs performed at [name]  click here if you don't have a  MyChart account or go to [email or web address]  Consent: (Patient) Tricia Curry provided verbal consent for this virtual visit at the beginning of the encounter.  Current Medications:  Current Outpatient Medications:    amoxicillin -clavulanate (AUGMENTIN ) 875-125 MG tablet, Take 1 tablet by mouth 2 (two) times daily for 10 days., Disp: 20 tablet, Rfl: 0   omeprazole (PRILOSEC) 20 MG capsule, Take 1 capsule (20 mg total) by mouth daily., Disp: 30 capsule, Rfl: 0   ACCU-CHEK GUIDE test strip, USE UP TO FOUR TIMES DAILY AS NEEDED, Disp: 100 strip, Rfl: 2   Accu-Chek Softclix Lancets lancets, USE UP TO FOUR TIMES DAILY AS DIRECTED, Disp: 100 each, Rfl: 2   albuterol  (VENTOLIN  HFA) 108 (90 Base) MCG/ACT inhaler, INHALE 1 TO 2 PUFFS INTO THE LUNGS EVERY 6 HOURS AS NEEDED FOR WHEEZING OR SHORTNESS OF BREATH, Disp: 18 g, Rfl: 2   benzonatate  (TESSALON  PERLES) 100 MG capsule, Take 1 capsule (100 mg total) by mouth every 6 (six) hours as needed., Disp: 30 capsule, Rfl: 1   blood glucose meter kit and supplies KIT, Dispense based on patient and insurance preference. Use up to four times daily as directed. (FOR ICD-9 250.00, 250.01)., Disp: 1 each, Rfl: 0   Chlorphen-PE-Acetaminophen  ([name] AD) 4-10-325 MG TABS, Take 1 tablet by mouth daily as needed., Disp: 30 tablet, Rfl: 1   diclofenac  Sodium (VOLTAREN ) 1 % GEL, Apply 2 g topically 4 (four) times daily., Disp: 50 g, Rfl: 1   diphenhydrAMINE  (BENADRYL ) 25 mg capsule, Take 25 mg by  mouth daily as needed for allergies., Disp: , Rfl:    glucose blood (ACCU-CHEK GUIDE) test strip, USE UP TO FOUR TIMES DAILY AS NEEDED, Disp: 100 strip, Rfl: 2   methocarbamol  (ROBAXIN ) 500 MG tablet, Take 1 tablet (500 mg total) by mouth 2 (two) times daily., Disp: 20 tablet, Rfl: 0   naproxen  (NAPROSYN ) 500 MG tablet, Take 1 tablet (500 mg total) by mouth 2 (two) times daily., Disp: 30 tablet, Rfl: 0   nystatin  (MYCOSTATIN ) 100000 UNIT/ML suspension, Take 5 mLs (500,000 Units total) by mouth 4 (four) times daily. Swish and spit, Disp: 60 mL, Rfl: 0   olopatadine  (PATANOL) 0.1 % ophthalmic solution, Place 1 drop into both eyes daily as needed for allergies., Disp: 5 mL, Rfl: 3   ondansetron  (ZOFRAN -ODT) 4 MG disintegrating tablet, Take 1 tablet (4 mg total) by mouth every 8 (eight) hours as needed., Disp: 20 tablet, Rfl: 0   valACYclovir  (VALTREX ) 500 MG tablet, TAKE 1 TABLET(500 MG) BY MOUTH TWICE DAILY, Disp: 180 tablet, Rfl: 3   Vitamin D , Ergocalciferol , (DRISDOL ) 1.25 MG (50000 UNIT) CAPS capsule, Take 1 capsule (50,000 Units total) by mouth 2 (two) times a week., Disp: 24 capsule, Rfl: 1   Medications ordered in this encounter:  Meds ordered this encounter  Medications   amoxicillin -clavulanate (AUGMENTIN ) 875-125 MG tablet    Sig: Take 1 tablet by mouth 2 (two) times daily for 10 days.    Dispense:  20 tablet    Refill:  0  Supervising Provider:   BLAISE ALEENE KIDD [[id number]]   omeprazole (PRILOSEC) 20 MG capsule    Sig: Take 1 capsule (20 mg total) by mouth daily.    Dispense:  30 capsule    Refill:  0    Supervising Provider:   BLAISE ALEENE KIDD [[id number]]     *If you need refills on other medications prior to your next appointment, please contact your pharmacy*  Follow-Up: Call back or seek an in-person evaluation if the symptoms worsen or if the condition fails to improve as anticipated.  [facility] [phone]  Other  Instructions  Diverticulitis  Diverticulitis happens when poop (stool) and bacteria get trapped in small pouches in the colon called diverticula. These pouches may form if you have a condition called diverticulosis. When the poop and bacteria get trapped, it can cause an infection and inflammation. Diverticulitis may cause severe stomach pain and diarrhea. It can also lead to tissue damage in your colon. This can cause bleeding or blockage. In some cases, the diverticula may burst (rupture). This can cause infected poop to go into other parts of your abdomen. What are the causes? This condition is caused by poop getting trapped in the diverticula. This allows bacteria to grow. It can lead to inflammation and infection. What increases the risk? You are more likely to get this condition if you have diverticulosis. You are also more at risk if: You are overweight or obese. You do not get enough exercise. You drink alcohol. You smoke. You eat a lot of red meat, such as beef, pork, or lamb. You do not get enough fiber. Foods high in fiber include fruits, vegetables, beans, nuts, and whole grains. You are over 29 years of age. What are the signs or symptoms? Symptoms of this condition may include: Pain and tenderness in the abdomen. This pain is often felt on the left side but may occur in other spots. Fever and chills. Nausea and vomiting. Cramping. Bloating. Changes in how often you poop. Blood in your poop. How is this diagnosed? This condition is diagnosed based on your medical history and a physical exam. You may also have tests done to make sure there is nothing else causing your condition. These tests may include: Blood tests. Tests done on your pee (urine). A CT scan of the abdomen. You may need to have a colonoscopy. This is an exam to look at your whole large intestine. During the exam, a tube is put into the opening of your butt (anus) and then moved into your rectum, colon, and  other parts of the large intestine. This exam is done to look at the diverticula. It can also see if there is something else that may be causing your symptoms. How is this treated? Most cases are mild and can be treated at home. You may be told to: Take over-the-counter pain medicine. Only eat and drink clear liquids. Take antibiotics. Rest. More severe cases may need to be treated at a hospital. Treatment may include: Not eating or drinking. Taking pain medicines. Getting antibiotics through an IV. Getting fluids and nutrition through an IV. Surgery. Follow these instructions at home: Medicines Take over-the-counter and prescription medicines only as told by your health care provider. These include fiber supplements, probiotics, and medicines to soften your poop (stool softeners). If you were prescribed antibiotics, take them as told by your provider. Do not stop using the antibiotic even if you start to feel better. Ask your provider if the  medicine prescribed to you requires you to avoid driving or using machinery. Eating and drinking  Follow the diet told by your provider. You may need to only eat and drink liquids. After your symptoms get better, you may be able to return to a more normal diet. You may be told to eat at least 25 grams (25 g) of fiber each day. Fiber makes it easier to poop. Healthy sources of fiber include: Berries. One cup has 4-8 g of fiber. Beans or lentils. One-half cup has 5-8 g of fiber. Green vegetables. One cup has 4 g of fiber. Avoid eating red meat. General instructions Do not use any products that contain nicotine or tobacco. These products include cigarettes, chewing tobacco, and vaping devices, such as e-cigarettes. If you need help quitting, ask your provider. Exercise for at least 30 minutes, 3 times a week. Exercise hard enough to raise your heart rate and break a sweat. Contact a health care provider if: Your pain gets worse. Your pooping does  not go back to normal. Your symptoms do not get better with treatment. Your symptoms get worse all of a sudden. You have a fever. You vomit more than one time. Your poop is bloody, black, or tarry. This information is not intended to replace advice given to you by your health care provider. Make sure you discuss any questions you have with your health care provider. Document Revised: 06/28/2022 Document Reviewed: 06/28/2022 Elsevier Patient Education  2024 Elsevier Inc.   If you have been instructed to have an in-person evaluation today at a local Urgent Care facility, please use the link below. It will take you to a list of all of our available [facility], including address, phone number and hours of operation. Please do not delay care.  [facility]  If you or a family member do not have a primary care provider, use the link below to schedule a visit and establish care. When you choose a [facility] primary care physician or advanced practice provider, you gain a long-term partner in health. Find a Primary Care Provider  Learn more about [facility]'s in-office and virtual care options: [facility] - Get Care Now

## 2024-07-24 NOTE — Progress Notes (Signed)
 Virtual Visit Consent   [name], you are scheduled for a virtual visit with a [facility] provider today. Just as with appointments in the office, your consent must be obtained to participate. Your consent will be active for this visit and any virtual visit you may have with one of our providers in the next 365 days. If you have a MyChart account, a copy of this consent can be sent to you electronically.  As this is a virtual visit, video technology does not allow for your provider to perform a traditional examination. This may limit your provider's ability to fully assess your condition. If your provider identifies any concerns that need to be evaluated in person or the need to arrange testing (such as labs, EKG, etc.), we will make arrangements to do so. Although advances in technology are sophisticated, we cannot ensure that it will always work on either your end or our end. If the connection with a video visit is poor, the visit may have to be switched to a telephone visit. With either a video or telephone visit, we are not always able to ensure that we have a secure connection.  By engaging in this virtual visit, you consent to the provision of healthcare and authorize for your insurance to be billed (if applicable) for the services provided during this visit. Depending on your insurance coverage, you may receive a charge related to this service.  I need to obtain your verbal consent now. Are you willing to proceed with your visit today? Tricia Curry has provided verbal consent on 07/24/2024 for a virtual visit (video or telephone). [name], PA-C  Date: 07/24/2024 7:00 PM   Virtual Visit via Video Note   I, [name], connected with  Tricia Curry  ([id number], 06/22/1976) on 07/24/24 at  6:45 PM EDT by a video-enabled telemedicine application and verified that I am speaking with the correct person using two identifiers.  Location: Patient: Virtual Visit  Location Patient: Home Provider: Virtual Visit Location Provider: Home Office   I discussed the limitations of evaluation and management by telemedicine and the availability of in person appointments. The patient expressed understanding and agreed to proceed.    History of Present Illness: Tricia Curry is a 48 y.o. who identifies as a female who was assigned female at birth, and is being seen today for diverticulitis flare.  HPI: Abdominal Pain This is a recurrent (last flare was 05/2024) problem. The current episode started in the past 7 days (2 days). The onset quality is sudden. Episode frequency: waxes and wanes. The problem has been unchanged. Pain location: across entire abdomen. The pain is mild. The quality of the pain is colicky and aching. The abdominal pain radiates to the suprapubic region. Associated symptoms include flatus. Pertinent negatives include no belching, constipation, diarrhea, fever, frequency, hematochezia, melena, nausea or vomiting. Associated symptoms comments: gerd. The pain is aggravated by eating and bowel movement. Treatments tried: Ibuprofen . The treatment provided mild relief. Prior diagnostic workup includes CT scan.     Problems:  Patient Active Problem List   Diagnosis Date Noted   COVID-19 vaccination declined 09/18/2023   Influenza vaccination declined 09/18/2023   Class 3 severe obesity due to excess calories without serious comorbidity with body mass index (BMI) of 40.0 to 44.9 in adult [facility]) 09/18/2023   Encounter for annual health examination 04/12/2023   Elevated cholesterol 04/12/2023   Vitamin D  deficiency 04/12/2023   Class 3 severe obesity due to excess calories without  serious comorbidity with body mass index (BMI) of 45.0 to 49.9 in adult [facility]) 04/12/2023   Encounter for Papanicolaou smear of cervix 04/12/2023   Encounter for screening colonoscopy 04/12/2023   Encounter for screening mammogram for breast cancer 04/12/2023   Anxiety and  depression 04/14/2019   Iron deficiency anemia secondary to inadequate dietary iron intake 01/27/2019    Allergies:  Allergies  Allergen Reactions   Peanut-Containing Drug Products Anaphylaxis   Medications:  Current Outpatient Medications:    amoxicillin -clavulanate (AUGMENTIN ) 875-125 MG tablet, Take 1 tablet by mouth 2 (two) times daily for 10 days., Disp: 20 tablet, Rfl: 0   omeprazole (PRILOSEC) 20 MG capsule, Take 1 capsule (20 mg total) by mouth daily., Disp: 30 capsule, Rfl: 0   ACCU-CHEK GUIDE test strip, USE UP TO FOUR TIMES DAILY AS NEEDED, Disp: 100 strip, Rfl: 2   Accu-Chek Softclix Lancets lancets, USE UP TO FOUR TIMES DAILY AS DIRECTED, Disp: 100 each, Rfl: 2   albuterol  (VENTOLIN  HFA) 108 (90 Base) MCG/ACT inhaler, INHALE 1 TO 2 PUFFS INTO THE LUNGS EVERY 6 HOURS AS NEEDED FOR WHEEZING OR SHORTNESS OF BREATH, Disp: 18 g, Rfl: 2   benzonatate  (TESSALON  PERLES) 100 MG capsule, Take 1 capsule (100 mg total) by mouth every 6 (six) hours as needed., Disp: 30 capsule, Rfl: 1   blood glucose meter kit and supplies KIT, Dispense based on patient and insurance preference. Use up to four times daily as directed. (FOR ICD-9 250.00, 250.01)., Disp: 1 each, Rfl: 0   Chlorphen-PE-Acetaminophen  ([name] AD) 4-10-325 MG TABS, Take 1 tablet by mouth daily as needed., Disp: 30 tablet, Rfl: 1   diclofenac  Sodium (VOLTAREN ) 1 % GEL, Apply 2 g topically 4 (four) times daily., Disp: 50 g, Rfl: 1   diphenhydrAMINE  (BENADRYL ) 25 mg capsule, Take 25 mg by mouth daily as needed for allergies., Disp: , Rfl:    glucose blood (ACCU-CHEK GUIDE) test strip, USE UP TO FOUR TIMES DAILY AS NEEDED, Disp: 100 strip, Rfl: 2   methocarbamol  (ROBAXIN ) 500 MG tablet, Take 1 tablet (500 mg total) by mouth 2 (two) times daily., Disp: 20 tablet, Rfl: 0   naproxen  (NAPROSYN ) 500 MG tablet, Take 1 tablet (500 mg total) by mouth 2 (two) times daily., Disp: 30 tablet, Rfl: 0   nystatin  (MYCOSTATIN ) 100000 UNIT/ML suspension,  Take 5 mLs (500,000 Units total) by mouth 4 (four) times daily. Swish and spit, Disp: 60 mL, Rfl: 0   olopatadine  (PATANOL) 0.1 % ophthalmic solution, Place 1 drop into both eyes daily as needed for allergies., Disp: 5 mL, Rfl: 3   ondansetron  (ZOFRAN -ODT) 4 MG disintegrating tablet, Take 1 tablet (4 mg total) by mouth every 8 (eight) hours as needed., Disp: 20 tablet, Rfl: 0   valACYclovir  (VALTREX ) 500 MG tablet, TAKE 1 TABLET(500 MG) BY MOUTH TWICE DAILY, Disp: 180 tablet, Rfl: 3   Vitamin D , Ergocalciferol , (DRISDOL ) 1.25 MG (50000 UNIT) CAPS capsule, Take 1 capsule (50,000 Units total) by mouth 2 (two) times a week., Disp: 24 capsule, Rfl: 1  Observations/Objective: Patient is well-developed, well-nourished in no acute distress.  Resting comfortably at home.  Head is normocephalic, atraumatic.  No labored breathing.  Speech is clear and coherent with logical content.  Patient is alert and oriented at baseline.    Assessment and Plan: 1. Diverticulitis (Primary) - amoxicillin -clavulanate (AUGMENTIN ) 875-125 MG tablet; Take 1 tablet by mouth 2 (two) times daily for 10 days.  Dispense: 20 tablet; Refill: 0  2. Gastroesophageal reflux disease  without esophagitis - omeprazole (PRILOSEC) 20 MG capsule; Take 1 capsule (20 mg total) by mouth daily.  Dispense: 30 capsule; Refill: 0  - Recurrent diverticulitis - Augmentin  sent as this is normally effective for her - Liquid diet until pain resolves and then increase diet slowly as tolerated - Prilosec added for GERD symptoms - Strict ER precautions if pain worsens or fails to resolve in 2-3 days - Seek in person evaluation if not improving or if worsens  Follow Up Instructions: I discussed the assessment and treatment plan with the patient. The patient was provided an opportunity to ask questions and all were answered. The patient agreed with the plan and demonstrated an understanding of the instructions.  A copy of instructions were sent to  the patient via MyChart unless otherwise noted below.    The patient was advised to call back or seek an in-person evaluation if the symptoms worsen or if the condition fails to improve as anticipated.    [name], PA-C

## 2024-08-06 DIAGNOSIS — F4323 Adjustment disorder with mixed anxiety and depressed mood: Secondary | ICD-10-CM | POA: Diagnosis not present

## 2024-08-20 DIAGNOSIS — F4323 Adjustment disorder with mixed anxiety and depressed mood: Secondary | ICD-10-CM | POA: Diagnosis not present

## 2024-08-28 ENCOUNTER — Other Ambulatory Visit: Payer: Self-pay

## 2024-08-28 ENCOUNTER — Ambulatory Visit: Admitting: Obstetrics & Gynecology

## 2024-09-03 DIAGNOSIS — F4323 Adjustment disorder with mixed anxiety and depressed mood: Secondary | ICD-10-CM | POA: Diagnosis not present

## 2024-09-11 ENCOUNTER — Other Ambulatory Visit (HOSPITAL_COMMUNITY)

## 2024-09-24 ENCOUNTER — Telehealth (INDEPENDENT_AMBULATORY_CARE_PROVIDER_SITE_OTHER): Payer: Self-pay | Admitting: Nurse Practitioner

## 2024-09-24 ENCOUNTER — Encounter: Payer: Self-pay | Admitting: Nurse Practitioner

## 2024-09-24 DIAGNOSIS — Z139 Encounter for screening, unspecified: Secondary | ICD-10-CM

## 2024-09-24 DIAGNOSIS — Z6841 Body Mass Index (BMI) 40.0 and over, adult: Secondary | ICD-10-CM | POA: Diagnosis not present

## 2024-09-24 DIAGNOSIS — E559 Vitamin D deficiency, unspecified: Secondary | ICD-10-CM | POA: Diagnosis not present

## 2024-09-24 DIAGNOSIS — E7800 Pure hypercholesterolemia, unspecified: Secondary | ICD-10-CM | POA: Diagnosis not present

## 2024-09-24 DIAGNOSIS — E66813 Obesity, class 3: Secondary | ICD-10-CM

## 2024-09-24 NOTE — Progress Notes (Unsigned)
 Virtual Visit via Video Note  Tricia Curry, CMA,acting as a scribe for Gaines Ada, FNP.,have documented all relevant documentation on the behalf of Gaines Ada, FNP,as directed by  Gaines Ada, FNP while in the presence of Gaines Ada, FNP.  I connected with Tricia Curry on 09/24/2024 at  9:40 AM EST by a video enabled telemedicine application and verified that I am speaking with the correct person using two identifiers.  Patient Location: Other:  work {Provider Location:[phone]::Home Office}  I discussed the limitations, risks, security, and privacy concerns of performing an evaluation and management service by video and the availability of in person appointments. I also discussed with the patient that there may be a patient responsible charge related to this service. The patient expressed understanding and agreed to proceed.  Subjective: PCP: Ada Gaines, FNP  Chief Complaint  Patient presents with   Hyperlipidemia    Patient presents today for a chol follow up, Patient reports compliance with medication. Patient denies any chest pain, SOB, or headaches. Patient has no concerns today.    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 She has been having joint swelling and occassionally will take tylenol  with some benefit. She is not taking any medication for cholesterol.  She is unable to exercise much due to the pain in her hip.      ROS: Per HPI Current Medications[1]  Observations/Objective: There were no vitals filed for this visit. Physical Exam  Assessment and Plan: Elevated cholesterol    Follow Up Instructions: No follow-ups on file.   I discussed the assessment and treatment plan with the patient. The patient was provided an opportunity to ask questions, and all were answered. The patient agreed with the plan and demonstrated an understanding of the instructions.   The patient was advised  to call back or seek an in-person evaluation if the symptoms worsen or if the condition fails to improve as anticipated.  The above assessment and management plan was discussed with the patient. The patient verbalized understanding of and has agreed to the management plan.   Tricia Gaines Ada, FNP, have reviewed all documentation for this visit. The documentation on 09/24/2024 for the exam, diagnosis, procedures, and orders are all accurate and complete.      [1]  Current Outpatient Medications:    ACCU-CHEK GUIDE test strip, USE UP TO FOUR TIMES DAILY AS NEEDED, Disp: 100 strip, Rfl: 2   Accu-Chek Softclix Lancets lancets, USE UP TO FOUR TIMES DAILY AS DIRECTED, Disp: 100 each, Rfl: 2   albuterol  (VENTOLIN  HFA) 108 (90 Base) MCG/ACT inhaler, INHALE 1 TO 2 PUFFS INTO THE LUNGS EVERY 6 HOURS AS NEEDED FOR WHEEZING OR SHORTNESS OF BREATH, Disp: 18 g, Rfl: 2   benzonatate  (TESSALON  PERLES) 100 MG capsule, Take 1 capsule (100 mg total) by mouth every 6 (six) hours as needed., Disp: 30 capsule, Rfl: 1   blood glucose meter kit and supplies KIT, Dispense based on patient and insurance preference. Use up to four times daily as directed. (FOR ICD-9 250.00, 250.01)., Disp: 1 each, Rfl: 0   Chlorphen-PE-Acetaminophen  ([name] AD) 4-10-325 MG TABS, Take 1 tablet by mouth daily as needed., Disp: 30 tablet, Rfl: 1   diclofenac  Sodium (VOLTAREN ) 1 % GEL, Apply 2 g topically 4 (four) times daily., Disp: 50 g, Rfl: 1   diphenhydrAMINE  (BENADRYL ) 25 mg capsule, Take 25 mg by mouth daily as needed for allergies., Disp: ,  Rfl:    glucose blood (ACCU-CHEK GUIDE) test strip, USE UP TO FOUR TIMES DAILY AS NEEDED, Disp: 100 strip, Rfl: 2   methocarbamol  (ROBAXIN ) 500 MG tablet, Take 1 tablet (500 mg total) by mouth 2 (two) times daily., Disp: 20 tablet, Rfl: 0   naproxen  (NAPROSYN ) 500 MG tablet, Take 1 tablet (500 mg total) by mouth 2 (two) times daily., Disp: 30 tablet, Rfl: 0   nystatin  (MYCOSTATIN ) 100000 UNIT/ML  suspension, Take 5 mLs (500,000 Units total) by mouth 4 (four) times daily. Swish and spit, Disp: 60 mL, Rfl: 0   olopatadine  (PATANOL) 0.1 % ophthalmic solution, Place 1 drop into both eyes daily as needed for allergies., Disp: 5 mL, Rfl: 3   omeprazole  (PRILOSEC) 20 MG capsule, Take 1 capsule (20 mg total) by mouth daily., Disp: 30 capsule, Rfl: 0   ondansetron  (ZOFRAN -ODT) 4 MG disintegrating tablet, Take 1 tablet (4 mg total) by mouth every 8 (eight) hours as needed., Disp: 20 tablet, Rfl: 0   valACYclovir  (VALTREX ) 500 MG tablet, TAKE 1 TABLET(500 MG) BY MOUTH TWICE DAILY, Disp: 180 tablet, Rfl: 3   Vitamin D , Ergocalciferol , (DRISDOL ) 1.25 MG (50000 UNIT) CAPS capsule, Take 1 capsule (50,000 Units total) by mouth 2 (two) times a week., Disp: 24 capsule, Rfl: 1

## 2024-09-28 ENCOUNTER — Other Ambulatory Visit: Payer: Self-pay

## 2024-09-28 DIAGNOSIS — Z139 Encounter for screening, unspecified: Secondary | ICD-10-CM

## 2024-09-28 DIAGNOSIS — E7800 Pure hypercholesterolemia, unspecified: Secondary | ICD-10-CM

## 2024-09-29 LAB — LIPID PANEL
Chol/HDL Ratio: 3.4 ratio (ref 0.0–4.4)
Cholesterol, Total: 162 mg/dL (ref 100–199)
HDL: 48 mg/dL (ref >39)
LDL Chol Calc (NIH): 94 mg/dL (ref 0–99)
Triglycerides: 112 mg/dL (ref 0–149)
VLDL Cholesterol Cal: 20 mg/dL (ref 5–40)

## 2024-09-29 LAB — HEPATITIS B SURFACE ANTIBODY,QUALITATIVE: Hep B Surface Ab, Qual: REACTIVE

## 2024-10-04 ENCOUNTER — Ambulatory Visit: Payer: Self-pay | Admitting: Nurse Practitioner

## 2024-10-04 NOTE — Assessment & Plan Note (Signed)
 Will check vitamin D  level and supplement as needed.    Also encouraged to spend 15 minutes in the sun daily.

## 2024-10-04 NOTE — Assessment & Plan Note (Signed)
 She is encouraged to strive for BMI less than 30 to decrease cardiac risk. Advised to aim for at least 150 minutes of exercise per week as tolerated due to knee pain and diagnosis of arthritis from the orthopedic. She is encouraged to do water exercises may be better for her knees.

## 2024-10-04 NOTE — Assessment & Plan Note (Signed)
 Stable, diet controlled and continue low fat diet. Come to office within next week for labs.

## 2024-10-06 ENCOUNTER — Ambulatory Visit: Payer: Self-pay | Admitting: Nurse Practitioner

## 2024-10-20 ENCOUNTER — Other Ambulatory Visit: Payer: Self-pay | Admitting: Medical Genetics

## 2024-10-20 DIAGNOSIS — Z006 Encounter for examination for normal comparison and control in clinical research program: Secondary | ICD-10-CM

## 2024-11-08 LAB — GENECONNECT MOLECULAR SCREEN: Genetic Analysis Overall Interpretation: NEGATIVE

## 2025-04-05 ENCOUNTER — Encounter: Payer: Self-pay | Admitting: Nurse Practitioner
